# Patient Record
Sex: Female | Born: 1975 | Race: White | Hispanic: Yes | Marital: Single | Smoking: Never smoker
Health system: Southern US, Community
[De-identification: ages and names within clinical notes are randomized; demographics above are authoritative.]

## PROBLEM LIST (undated history)

## (undated) ENCOUNTER — Inpatient Hospital Stay (HOSPITAL_COMMUNITY): Payer: Self-pay

## (undated) DIAGNOSIS — Z789 Other specified health status: Secondary | ICD-10-CM

## (undated) HISTORY — PX: NO PAST SURGERIES: SHX2092

---

## 1999-03-29 ENCOUNTER — Emergency Department (HOSPITAL_COMMUNITY): Admission: EM | Admit: 1999-03-29 | Discharge: 1999-03-29 | Payer: Self-pay | Admitting: Emergency Medicine

## 2007-01-20 ENCOUNTER — Inpatient Hospital Stay (HOSPITAL_COMMUNITY): Admission: AD | Admit: 2007-01-20 | Discharge: 2007-01-25 | Payer: Self-pay | Admitting: Obstetrics

## 2007-01-22 ENCOUNTER — Encounter (INDEPENDENT_AMBULATORY_CARE_PROVIDER_SITE_OTHER): Payer: Self-pay | Admitting: Specialist

## 2010-03-21 ENCOUNTER — Inpatient Hospital Stay (HOSPITAL_COMMUNITY): Admission: RE | Admit: 2010-03-21 | Discharge: 2010-03-24 | Payer: Self-pay | Admitting: Obstetrics

## 2010-03-21 ENCOUNTER — Encounter: Payer: Self-pay | Admitting: Obstetrics

## 2011-02-21 LAB — CBC
HCT: 29.2 % — ABNORMAL LOW (ref 36.0–46.0)
Hemoglobin: 10.1 g/dL — ABNORMAL LOW (ref 12.0–15.0)
Hemoglobin: 11.9 g/dL — ABNORMAL LOW (ref 12.0–15.0)
MCHC: 33.6 g/dL (ref 30.0–36.0)
MCV: 89.8 fL (ref 78.0–100.0)
Platelets: 156 10*3/uL (ref 150–400)
RBC: 3.91 MIL/uL (ref 3.87–5.11)
WBC: 8.3 10*3/uL (ref 4.0–10.5)

## 2011-04-21 NOTE — Op Note (Signed)
NAMELENOX, LADOUCEUR                 ACCOUNT NO.:  [id number]   MEDICAL RECORD NO.:  [id number]          PATIENT TYPE:  INP   LOCATION:  9160                          FACILITY:  [facility]   PHYSICIAN:  Charles A. Clearance Coots, M.D.DATE OF BIRTH:  1976-11-07   DATE OF PROCEDURE:  01/22/2007  DATE OF DISCHARGE:                               OPERATIVE REPORT   PREOPERATIVE DIAGNOSES:  1. Marginal abruption.  2. Probable cephalopelvic disproportion.  3. Postdates.  4. Induction of labor.   POSTOPERATIVE DIAGNOSES:  1. Marginal abruption.  2. Probable cephalopelvic disproportion.  3. Postdates.  4. Induction of labor.   PROCEDURE:  Primary low transverse cesarean section.   SURGEON:  Coral Ceo, MD.   ASSISTANTS:  Kathlee Nations, surgical technician.  Sheila Oats, certified surgical technician.   ANESTHESIA:  Epidural.   ESTIMATED BLOOD LOSS:  700 mL.   IV FLUIDS:  3100 mL.   URINE OUTPUT:  300 mL.   COMPLICATIONS:  None.   DRAIN:  Foley to gravity.   FINDINGS:  A Viable female at 33, Apgars of 9 at 1 and 9 at 5, weight of  8 pounds 12 ounces.  Normal uterus, ovaries and fallopian tubes.   OPERATION:  The patient was brought to operating room and after  satisfactory redosing of the epidural, the abdomen was prepped and  draped in the usual sterile fashion.  A Pfannenstiel skin incision was  made with a scalpel and that was deepened down to the fascia with the  scalpel.  Fascia was nicked in the midline and the fascial incision was  extended to the left and to the right with curved Mayo scissors.  The  superior and inferior fascial edges were taken off the rectus muscles  with both blunt and sharp dissection.  Rectus muscle was bluntly divided  in the midline.  Peritoneum was entered digitally.  The peritoneum was  digitally extended to the left and to the right.  The bladder blade was  positioned.  The vesicouterine fold of peritoneum above the reflection  of the urinary  bladder was grasped with forceps and was incised and  undermined with Metzenbaum scissors.  The incision was extended to the  left and to the right with the Metzenbaum scissors.  The bladder flap  was bluntly developed and the bladder blade was repositioned in front of  the urinary bladder, placing it well out of the operative field.  The  uterus was then entered transversely in the lower uterine segment with a  scalpel.  The uterine incision was extended to the left and to right  digitally.  Clear amniotic fluid was expelled.  The vertex was then  brought up into the incision and the occiput was rotated into the  incision and delivered with the aid of fundal pressure from the  assistant.  Infant's nose and mouth were suctioned with a suction bulb  and delivery was completed with the aid of fundal pressure from the  assistant.  Umbilical cord was doubly clamped and cut and the infant was  handed off to the nursery staff.  Cord blood was obtained.  The placenta  was manually removed from the uterus intact.  The endometrial surface  was then thoroughly debrided with a dry lap sponge.  The edges of the  uterine incision were grasped with ring forceps.  The uterus was closed  with continuous interlocking suture of 0 Monocryl from each corner to  the center.  Hemostasis was excellent.  The bladder flap was then closed  with a continuous suture of 3-0 Monocryl.  The pelvic cavity was  thoroughly irrigated with warm saline solution and all clots were  removed.  Peritoneum was closed with a continuous suture of 2-0  Monocryl.  Fascia was closed with continuous suture of 0 Vicryl.  Subcutaneous tissue was thoroughly irrigated with warm saline solution  and all areas of subcutaneous bleeding were coagulated with the Bovie.  Skin was then closed with a continuous subcuticular suture of 3-0  Monocryl.  A sterile bandage was applied to the incision closure.  The  surgical technician indicated that all  needle, sponge and instrument  counts were correct x2.  The patient tolerated the procedure well and  was transported to the recovery room in satisfactory condition.      Charles A. Clearance Coots, M.D.  Electronically Signed     CAH/MEDQ  D:  01/22/2007  T:  01/22/2007  Job:  [id number]

## 2011-04-21 NOTE — Discharge Summary (Signed)
Katie Blackburn, Katie Blackburn                 ACCOUNT NO.:  [id number]   MEDICAL RECORD NO.:  [id number]          PATIENT TYPE:  INP   LOCATION:  9101                          FACILITY:  [facility]   PHYSICIAN:  Charles A. Clearance Coots, M.D.DATE OF BIRTH:  20-Sep-1976   DATE OF ADMISSION:  01/20/2007  DATE OF DISCHARGE:  01/25/2007                               DISCHARGE SUMMARY   ADMITTING DIAGNOSIS:  Post dates pregnancy induction of labor.   DISCHARGE DIAGNOSES:  1. Post dates pregnancy induction of labor.  2. Status post primary low transverse cesarean section on 01/22/2007      for marginal abruption.  Viable female delivered at 77.  Apgars of 9 at 1 minute and 9 at 5  minutes.  Weight of 3975 grams, length of 52 cm.  Mother and infant  discharged home in good condition.   REASON FOR ADMISSION:  A 35 year old G2 P0, estimated date of  confinement of 01/13/2007, admitted for induction of labor for post-  dates.  Prenatal care was uncomplicated.  Group B strep was negative.   PAST MEDICAL HISTORY:  Surgery - none.  Illnesses - none.   MEDICATIONS:  Prenatal vitamins.   ALLERGIES:  NO KNOWN DRUG ALLERGIES.   SOCIAL HISTORY:  Married.  Negative tobacco, alcohol or recreational  drug use.   PHYSICAL EXAM:  GENERAL:  Well-nourished, well-developed female in no  acute distress.  VITAL SIGNS:  She is afebrile, vital signs are stable.  LUNGS:  Clear to auscultation bilaterally.  HEART: Regular rate and rhythm.  ABDOMEN:  Gravid, nontender.  PELVIC:  Cervix 1-2 cm dilated, 50% effaced, vertex at -3 station.   ADMITTING LABORATORY VALUES:  Hemoglobin 12, hematocrit 34.2, white  blood cell count 8000, platelets 197,000.  RPR was nonreactive.   HOSPITAL COURSE:  The patient was admitted and cervical ripening was  done overnight.  Low-dose Pitocin was started the following morning.  The patient progressed to 4-5 cm dilatation and the vertex was not well  engaged in the pelvis and was ballottable, and  after examination at  0800, the patient started having heavy vaginal bleeding with clots.  Uterine contractions were every 3-5 minutes with decreased variability.  A decision was made to proceed with cesarean section delivery for  marginal abruption.  Primary low transverse cesarean section was  performed without complications on 01/22/2007.  Postoperative course was  uncomplicated.  The patient was discharged home on postop day #3 in good  condition.   DISCHARGE LABORATORY VALUES:  Hemoglobin 8.5, hematocrit 24.2, white  blood cell count 10,600, platelets 134,000.   DISCHARGE MEDICATIONS:  Tylox and ibuprofen was prescribed for pain.  Continue prenatal vitamins.   DISCHARGE INSTRUCTIONS:  Routine written instructions were given for  discharge after cesarean section.  The patient is to call the office for  a followup appointment in 2 weeks.      Charles A. Clearance Coots, M.D.  Electronically Signed     CAH/MEDQ  D:  03/07/2007  T:  03/07/2007  Job:  [id number]

## 2014-10-15 ENCOUNTER — Encounter (HOSPITAL_COMMUNITY): Payer: Self-pay | Admitting: *Deleted

## 2014-10-15 ENCOUNTER — Inpatient Hospital Stay (HOSPITAL_COMMUNITY)
Admission: AD | Admit: 2014-10-15 | Discharge: 2014-10-16 | Disposition: A | Discharge status: Home / Self Care | Payer: Self-pay | Source: Ambulatory Visit | Attending: Obstetrics & Gynecology | Admitting: Obstetrics & Gynecology

## 2014-10-15 DIAGNOSIS — O039 Complete or unspecified spontaneous abortion without complication: Secondary | ICD-10-CM | POA: Insufficient documentation

## 2014-10-15 DIAGNOSIS — O209 Hemorrhage in early pregnancy, unspecified: Secondary | ICD-10-CM

## 2014-10-15 DIAGNOSIS — O4691 Antepartum hemorrhage, unspecified, first trimester: Secondary | ICD-10-CM | POA: Insufficient documentation

## 2014-10-15 HISTORY — DX: Other specified health status: Z78.9

## 2014-10-15 LAB — CBC WITH DIFFERENTIAL/PLATELET
BASOS ABS: 0 10*3/uL (ref 0.0–0.1)
Basophils Relative: 0 % (ref 0–1)
Eosinophils Absolute: 0.1 10*3/uL (ref 0.0–0.7)
Eosinophils Relative: 1 % (ref 0–5)
HCT: 34.8 % — ABNORMAL LOW (ref 36.0–46.0)
Hemoglobin: 11.8 g/dL — ABNORMAL LOW (ref 12.0–15.0)
LYMPHS ABS: 2.4 10*3/uL (ref 0.7–4.0)
LYMPHS PCT: 30 % (ref 12–46)
MCH: 29.9 pg (ref 26.0–34.0)
MCHC: 33.9 g/dL (ref 30.0–36.0)
MCV: 88.3 fL (ref 78.0–100.0)
Monocytes Absolute: 0.3 10*3/uL (ref 0.1–1.0)
Monocytes Relative: 4 % (ref 3–12)
NEUTROS ABS: 5 10*3/uL (ref 1.7–7.7)
Neutrophils Relative %: 65 % (ref 43–77)
PLATELETS: 180 10*3/uL (ref 150–400)
RBC: 3.94 MIL/uL (ref 3.87–5.11)
RDW: 13 % (ref 11.5–15.5)
WBC: 7.8 10*3/uL (ref 4.0–10.5)

## 2014-10-15 LAB — WET PREP, GENITAL
Clue Cells Wet Prep HPF POC: NONE SEEN
Trich, Wet Prep: NONE SEEN
WBC, Wet Prep HPF POC: NONE SEEN
Yeast Wet Prep HPF POC: NONE SEEN

## 2014-10-15 LAB — HCG, QUANTITATIVE, PREGNANCY: HCG, BETA CHAIN, QUANT, S: 6539 m[IU]/mL — AB (ref <5)

## 2014-10-15 NOTE — MAU Note (Signed)
Pt reports vaginal bleeding since this pm, lower abd pain.

## 2014-10-15 NOTE — MAU Provider Note (Signed)
History     CSN: [id number]  Arrival date and time: 10/15/14 2055   None     Chief Complaint  Patient presents with  . Vaginal Bleeding  . Abdominal Pain   Patient is a 38 y.o. female presenting with vaginal bleeding. The history is provided by the patient. The history is limited by a language barrier. A language interpreter was used.  Vaginal Bleeding This is a new problem. The current episode started today. The problem occurs constantly. The problem has been gradually worsening. Associated symptoms include abdominal pain. Pertinent negatives include no chest pain, chills, coughing, fever, headaches, nausea, rash, sore throat or vomiting. Nothing aggravates the symptoms. She has tried nothing for the symptoms.    Katie Blackburn is a 38 y.o. G3P2 @ 5582w0d gestation who presents to the MAU with vaginal bleeding that started earlier today. She states the bleeding has gotten heavier and the cramping is worse.  She rates her pain as 10/10.   OB History    Gravida Para Term Preterm AB TAB SAB Ectopic Multiple Living   3 2        2       Past Medical History  Diagnosis Date  . Medical history non-contributory     Past Surgical History  Procedure Laterality Date  . No past surgeries      History reviewed. No pertinent family history.  History  Substance Use Topics  . Smoking status: Never Smoker   . Smokeless tobacco: Not on file  . Alcohol Use: No    Allergies: No Known Allergies  Prescriptions prior to admission  Medication Sig Dispense Refill Last Dose  . Prenatal Vit-Fe Fumarate-FA (MULTIVITAMIN-PRENATAL) 27-0.8 MG TABS tablet Take 1 tablet by mouth daily at 12 noon.   10/15/2014 at Unknown time    Review of Systems  Constitutional: Negative for fever and chills.  HENT: Negative for ear pain and sore throat.   Eyes: Negative for pain and redness.  Respiratory: Negative for cough and wheezing.   Cardiovascular: Negative for chest pain and palpitations.   Gastrointestinal: Positive for abdominal pain. Negative for nausea and vomiting.  Genitourinary: Positive for frequency and vaginal bleeding. Negative for urgency.  Skin: Negative for rash.  Neurological: Negative for dizziness and headaches.     Blood pressure 112/80, pulse 72, resp. rate 18, height 5' 1.5" (1.562 m), weight 117 lb (53.071 kg), last menstrual period 08/13/2014, SpO2 100 %.  Physical Exam  Nursing note and vitals reviewed. Constitutional: She is oriented to person, place, and time. She appears well-developed and well-nourished.  HENT:  Head: Normocephalic and atraumatic.  Eyes: Conjunctivae and EOM are normal.  Neck: Neck supple.  Cardiovascular: Normal rate.   Respiratory: Effort normal.  GI: Soft. There is tenderness.  Mildly tender lower abdomen with palpation.   Genitourinary:  External genitalia without lesions. Moderate blood with clots vaginal vault. Cervix closed, mild CMT, mild bilateral adnexal tenderness, uterus slightly enlarged.   Musculoskeletal: Normal range of motion.  Neurological: She is alert and oriented to person, place, and time.  Skin: Skin is warm and dry.  Psychiatric: She has a normal mood and affect. Her behavior is normal.    MAU Course  Procedures Results for orders placed or performed during the hospital encounter of 10/15/14 (from the past 24 hour(s))  CBC with Differential     Status: Abnormal   Collection Time: 10/15/14  9:08 PM  Result Value Ref Range   WBC 7.8 4.0 - 10.5 K/uL  RBC 3.94 3.87 - 5.11 MIL/uL   Hemoglobin 11.8 (L) 12.0 - 15.0 g/dL   HCT 16.134.8 (L) 09.636.0 - 04.546.0 %   MCV 88.3 78.0 - 100.0 fL   MCH 29.9 26.0 - 34.0 pg   MCHC 33.9 30.0 - 36.0 g/dL   RDW 40.913.0 81.111.5 - 91.415.5 %   Platelets 180 150 - 400 K/uL   Neutrophils Relative % 65 43 - 77 %   Neutro Abs 5.0 1.7 - 7.7 K/uL   Lymphocytes Relative 30 12 - 46 %   Lymphs Abs 2.4 0.7 - 4.0 K/uL   Monocytes Relative 4 3 - 12 %   Monocytes Absolute 0.3 0.1 - 1.0 K/uL    Eosinophils Relative 1 0 - 5 %   Eosinophils Absolute 0.1 0.0 - 0.7 K/uL   Basophils Relative 0 0 - 1 %   Basophils Absolute 0.0 0.0 - 0.1 K/uL  hCG, quantitative, pregnancy     Status: Abnormal   Collection Time: 10/15/14  9:08 PM  Result Value Ref Range   hCG, Beta Chain, Quant, S 6539 (H) <5 mIU/mL  ABO/Rh     Status: None (Preliminary result)   Collection Time: 10/15/14  9:08 PM  Result Value Ref Range   ABO/RH(D) O POS   Wet prep, genital     Status: None   Collection Time: 10/15/14 11:30 PM  Result Value Ref Range   Yeast Wet Prep HPF POC NONE SEEN NONE SEEN   Trich, Wet Prep NONE SEEN NONE SEEN   Clue Cells Wet Prep HPF POC NONE SEEN NONE SEEN   WBC, Wet Prep HPF POC NONE SEEN NONE SEEN    [place] Ob Comp Less 14 Wks  10/16/2014   CLINICAL DATA:  Pregnant patient with vaginal bleeding. Quantitative HCG 6,513.  EXAM: OBSTETRIC <14 WK US AND TRANSVAGINAL OB US  TECHNIQUE: Both transabdominal and transvaginal ultrasound examinations were performed for complete evaluation of the gestation as well as the maternal uterus, adnexal regions, and pelvic cul-de-sac. Transvaginal technique was performed to assess early pregnancy.  COMPARISON:  None.  FINDINGS: Intrauterine gestational sac: Visualized/normal in shape. The gestational sac is only appearance segment.  Yolk sac:  Visualized.  Embryo:  Visualized.  Cardiac Activity: Not detected.  CRL:   1.47  mm   7 w 6 d                  US EDC: 05/29/2015.  Maternal uterus/adnexae: Unremarkable.  IMPRESSION: Findings meet definitive criteria for failed pregnancy. This follows SRU consensus guidelines: Diagnostic Criteria for Nonviable Pregnancy Early in the First Trimester. Macy Mis Engl J Med [id number];369:1443-51. The gestational sack is in the lower uterine segment suggesting pending abortion.   Electronically Signed   By: Drusilla Kannerhomas  Dalessio M.D.   On: 10/16/2014 01:01   [place] Ob Transvaginal  10/16/2014   CLINICAL DATA:  Pregnant patient with vaginal bleeding.  Quantitative HCG 6,513.  EXAM: OBSTETRIC <14 WK US AND TRANSVAGINAL OB US  TECHNIQUE: Both transabdominal and transvaginal ultrasound examinations were performed for complete evaluation of the gestation as well as the maternal uterus, adnexal regions, and pelvic cul-de-sac. Transvaginal technique was performed to assess early pregnancy.  COMPARISON:  None.  FINDINGS: Intrauterine gestational sac: Visualized/normal in shape. The gestational sac is only appearance segment.  Yolk sac:  Visualized.  Embryo:  Visualized.  Cardiac Activity: Not detected.  CRL:   1.47  mm   7 w 6 d  [place] EDC: 05/29/2015.  Maternal uterus/adnexae: Unremarkable.  IMPRESSION: Findings meet definitive criteria for failed pregnancy. This follows SRU consensus guidelines: Diagnostic Criteria for Nonviable Pregnancy Early in the First Trimester. Macy Mis J Med [phone]. The gestational sack is in the lower uterine segment suggesting pending abortion.   Electronically Signed   By: Drusilla Kanner M.D.   On: 10/16/2014 01:01    Assessment and Plan  38 y.o. G3P2 @ [redacted]w[redacted]d by LMP and ultrasound showing 7 week 6 day failed pregnancy here tonight with vaginal bleeding and cramping that about 10 hours prior to arrival to MAU. Using the translator I discussed in detail with the patient clinical and ultrasound findings and options for miscarriage. Patient elects to take Cytotec and try to pass the pregnancy. I have scheduled a follow up with the GYN Clinic for 10/28/14. Patient voices understanding and agrees with plan. Instruction sheet on Cytotec given to the patient.    Medication List    TAKE these medications        HYDROcodone-acetaminophen 5-325 MG per tablet  Commonly known as:  NORCO  Take 1 tablet by mouth every 6 (six) hours as needed for moderate pain.     misoprostol 200 MCG tablet  Commonly known as:  CYTOTEC  Take 4 tablets (800 mcg total) by mouth 4 (four) times daily.     multivitamin-prenatal 27-0.8 MG  Tabs tablet  Take 1 tablet by mouth daily at 12 noon.     promethazine 25 MG tablet  Commonly known as:  PHENERGAN  Take 0.5 tablets (12.5 mg total) by mouth every 6 (six) hours as needed for nausea.          Katie Blackburn 10/16/2014, 1:12 AM

## 2014-10-16 ENCOUNTER — Inpatient Hospital Stay (HOSPITAL_COMMUNITY): Payer: Self-pay

## 2014-10-16 LAB — HIV ANTIBODY (ROUTINE TESTING W REFLEX): HIV 1&2 Ab, 4th Generation: NONREACTIVE

## 2014-10-16 LAB — GC/CHLAMYDIA PROBE AMP
CT PROBE, AMP APTIMA: NEGATIVE
GC PROBE AMP APTIMA: NEGATIVE

## 2014-10-16 LAB — RPR

## 2014-10-16 LAB — ABO/RH: ABO/RH(D): O POS

## 2014-10-16 MED ORDER — PROMETHAZINE HCL 25 MG PO TABS: 12.5000 mg | ORAL_TABLET | Freq: Four times a day (QID) | ORAL | Status: AC | PRN

## 2014-10-16 MED ORDER — MISOPROSTOL 200 MCG PO TABS: 800.0000 ug | ORAL_TABLET | Freq: Four times a day (QID) | ORAL | Status: DC

## 2014-10-16 MED ORDER — HYDROCODONE-ACETAMINOPHEN 5-325 MG PO TABS: 1.0000 | ORAL_TABLET | Freq: Four times a day (QID) | ORAL | Status: DC | PRN

## 2014-10-16 NOTE — Progress Notes (Signed)
I assisted Linnette RN with some questions, I also assisted the patient in Fertileunltrasound, by Orlan LeavensViria Alvarez Interpreter.

## 2014-10-16 NOTE — Discharge Instructions (Signed)
Follow up in the GYN Clinic 10/28/14.  If you have problems before then, return here   FACTS YOU SHOULD KNOW  WHAT IS AN EARLY PREGNANCY FAILURE? Once the egg is fertilized with the sperm and begins to develop, it attaches to the lining of the uterus. This early pregnancy tissue may not develop into an embryo (the beginning stage of a baby). Sometimes an embryo does develop but does not continue to grow. These problems can be seen on ultrasound.   MANAGEMNT OF EARLY PREGNANCY FAILURE: About 4 out of 100 (0.25%) women will have a pregnancy loss in her lifetime.  One in five pregnancies is found to be an early pregnancy failure.  There are 3 ways to care for an early pregnancy failure:   (1) Surgery, (2) Medicine, (3) Waiting for you to pass the pregnancy on your own. The decision as to how to proceed after being diagnosed with and early pregnancy failure is an individual one.  The decision can be made only after appropriate counseling.  You need to weigh the pros and cons of the 3 choices. Then you can make the choice that works for you. SURGERY (D&E)  Procedure over in 1 day  Requires being put to sleep  Bleeding may be light  Possible problems during surgery, including injury to womb(uterus)  Care provider has more control Medicine (CYTOTEC)  The complete procedure may take days to weeks  No Surgery  Bleeding may be heavy at times  There may be drug side effects  Patient has more control Waiting  You may choose to wait, in which case your own body may complete the passing of the abnormal early pregnancy on its own in about 2-4 weeks  Your bleeding may be heavy at times  There is a small possibility that you may need surgery if the bleeding is too much or not all of the pregnancy has passed. CYTOTEC MANAGEMENT Prostaglandins (cytotec) are the most widely used drug for this purpose. They cause the uterus to cramp and contract. You will place the medicine yourself inside your  vagina in the privacy of your home. Empting of the uterus should occur within 3 days but the process may continue for several weeks. The bleeding may seem heavy at times. POSSIBLE SIDE EFFECTS FROM CYTOTEC  Nausea   Vomiting  Diarrhea Fever  Chills  Hot Flashes Side effects  from the process of the early pregnancy failure include:  Cramping  Bleeding  Headaches  Dizziness RISKS: This is a low risk procedure. Less than 1 in 100 women has a complication. An incomplete passage of the early pregnancy may occur. Also, Hemorrhage (heavy bleeding) could happen.  Rarely the pregnancy will not be passed completely. Excessively heavy bleeding may occur.  Your doctor may need to perform surgery to empty the uterus (D&E). Afterwards: Everybody will feel differently after the early pregnancy completion. You may have soreness or cramps for a day or two. You may have soreness or cramps for day or two.  You may have light bleeding for up to 2 weeks. You may be as active as you feel like being. If you have any of the following problems you may call Maternity Admissions Unit at [phone].  If you have pain that does not get better  with pain medication  Bleeding that soaks through 2 thick full-sized sanitary pads in an hour  Cramps that last longer than 2 days  Foul smelling discharge  Fever above 100.4 degrees F Even if  you do not have any of these symptoms, you should have a follow-up exam to make sure you are healing properly. This appointment will be made for you before you leave the hospital. Your next normal period will start again in 4-6 week after the loss. You can get pregnant soon after the loss, so use birth control right away. Finally: Make sure all your questions are answered before during and after any procedure. Follow up with medical care and family planning methods.

## 2014-10-28 ENCOUNTER — Ambulatory Visit (INDEPENDENT_AMBULATORY_CARE_PROVIDER_SITE_OTHER): Payer: Self-pay | Admitting: Obstetrics & Gynecology

## 2014-10-28 ENCOUNTER — Encounter: Payer: Self-pay | Admitting: Obstetrics & Gynecology

## 2014-10-28 VITALS — BP 114/70 | HR 70 | Temp 98.2°F | Wt 116.9 lb

## 2014-10-28 DIAGNOSIS — Z30011 Encounter for initial prescription of contraceptive pills: Secondary | ICD-10-CM

## 2014-10-28 DIAGNOSIS — O021 Missed abortion: Secondary | ICD-10-CM | POA: Insufficient documentation

## 2014-10-28 MED ORDER — NORGESTIMATE-ETH ESTRADIOL 0.25-35 MG-MCG PO TABS: 1.0000 | ORAL_TABLET | Freq: Every day | ORAL | Status: DC

## 2014-10-28 NOTE — Progress Notes (Signed)
   CLINIC ENCOUNTER NOTE  History:  38 y.o. G3P2 here today for follow up after MAU visit on 10/16/14 when she was found to have a 8 week MAB and elected for misoprostol treatment.  Patient is Spanish-speaking only, [place] interpreter present for this encounter.  Reports having some bleeding but unsure if she passed tissue.  She does not desire any other pregnancy soon, desires OCPs for contraception. Will also use condoms.  The following portions of the patient's history were reviewed and updated as appropriate: allergies, current medications, past family history, past medical history, past social history, past surgical history and problem list. Normal pap in 2011.  Review of Systems:  Pertinent items are noted in HPI.  Objective:  Physical Exam  BP 114/70 mmHg  Pulse 70  Temp(Src) 98.2 F (36.8 C) (Oral)  Wt 116 lb 14.4 oz (53.025 kg)  LMP 08/13/2014 Gen: NAD Abd: Soft, nontender and nondistended Pelvic:Deferred  Clinic transabdominal ultrasound:  Not able to visualize uterus well, but nothing visualized within uterus  Labs and Imaging 10/16/2014   OBSTETRIC <14 WK US AND TRANSVAGINAL OB US CLINICAL DATA:  Pregnant patient with vaginal bleeding. Quantitative HCG 6,513.   TECHNIQUE: Both transabdominal and transvaginal ultrasound examinations were performed for complete evaluation of the gestation as well as the maternal uterus, adnexal regions, and pelvic cul-de-sac. Transvaginal technique was performed to assess early pregnancy.  COMPARISON:  None.  FINDINGS: Intrauterine gestational sac: Visualized/normal in shape. The gestational sac is only appearance segment.  Yolk sac:  Visualized.  Embryo:  Visualized.  Cardiac Activity: Not detected.  CRL:   1.47  mm   7 w 6 d                  US EDC: 05/29/2015.  Maternal uterus/adnexae: Unremarkable.  IMPRESSION: Findings meet definitive criteria for failed pregnancy. This follows SRU consensus guidelines: Diagnostic Criteria for  Nonviable Pregnancy Early in the First Trimester. Macy Mis Engl J Med [id number];369:1443-51. The gestational sack is in the lower uterine segment suggesting pending abortion.   Electronically Signed   By: Drusilla Kannerhomas  Dalessio M.D.   On: 10/16/2014 01:01   10/16/2014 HCG 6539   Assessment & Plan:  Likely completed treatment of MAB, will get HCG to confirm Sprintec prescribed Follow up as needed. Routine preventative health maintenance measures emphasized.   Jaynie CollinsUGONNA  Ronell Boldin, MD, FACOG Attending Obstetrician & Gynecologist [facility], [facility]

## 2014-10-28 NOTE — Progress Notes (Signed)
[employer] interpreter [phone]#[id number] used for this encounter,

## 2014-10-28 NOTE — Patient Instructions (Signed)
Regrese a la clinica cuando tenga su cita. Si tiene problemas o preguntas, llama a la clinica o vaya a la sala de [place]. Cuidados preventivos en los adultos (Preventive Care for Adults) Un estilo de vida saludable y los cuidados preventivos pueden favorecer la salud y [place]. Las pautas de salud preventivas para las mujeres incluyen las siguientes prcticas clave:  Un examen fsico de rutina anual y Optometrist estudios preventivos es un buen modo de Chief Technology Officer su salud. [place] de Publishing rights manager preocupaciones y Civil engineer, contracting el estado de su salud, y que le realicen estudios completos.  Consulte al dentista para realizar un examen de rutina y cuidados preventivos cada 6 meses. Cepllese los dientes al [employer]ys[employer]us veces por da y psese el hilo dental al menos una vez por da. Una buena higiene bucal evita caries y enfermedades de las encas.  La frecuencia con que debe hacerse exmenes de la vista depende de su edad, su estado de [place], su historia familiar, el uso de lentes de contacto y otros factores. Siga las recomendaciones del mdico para saber con qu frecuencia debe hacerse exmenes de la vista.  Consuma una dieta saludable. Los alimentos que contienen vegetales, las frutas, los cereales [place], los productos lcteos bajos en grasas y las protenas magras contienen nutrientes que son necesarios, sin consumir Nurse, mental health. Disminuya la ingesta de alimentos ricos en grasas slidas, azcares y sal agregadas. Consuma la cantidad de caloras adecuada para usted. Si es necesario, pdale informacin acerca de una dieta [place] a su mdico.  Realizar actividad fsica de forma regular es una de las prcticas ms importantes que puede hacer por su salud. Los adultos deben hacer al menos 150 minutos de ejercicios de intensidad moderada (cualquier actividad que aumente la frecuencia cardaca y lo haga transpirar) cada semana. Adems, la [employer] de los adultos  necesita practicar ejercicios de fortalecimiento muscular dos o ms veces por semana.  Mantenga un peso saludable. El ndice de masa corporal [facility]) es una herramienta que identifica posibles problemas con [place]. Proporciona una estimacin de la grasa corporal basndose en el peso y la altura. El mdico podr determinar su [facility] y ayudarlo a Scientist, forensic o Theatre manager un peso saludable. Para los adultos de 20 aos o ms:  Un [facility] menor de 18,5 se considera bajo peso.  Un [facility] entre 18,5 y 24,9 es normal.  Un [facility] entre 25 y 29,9 se considera sobrepeso.  Un IMC de 30 o ms se considera obesidad.  Realice actividad fsica y evite ingerir grasas saturadas para mantener un nivel normal de lpidos y Research scientist (life sciences). Consuma una dieta equilibrada y saludable, e incluya variedad de frutas y Photographer. A partir de los 20 aos se deben realizar anlisis de sangre a fin de Freight forwarder nivel de lpidos y colesterol en la sangre y [place] cada 5 aos. Si los niveles de lpidos o colesterol son altos, tiene ms de 50 aos o tiene riesgo elevado de sufrir enfermedades cardacas, Designer, industrial/product controlarse con ms frecuencia. Si tiene Coca Cola de lpidos y colesterol, debe recibir tratamiento con medicamentos, si la dieta y el ejercicio no estn funcionando.  Si fuma, consulte con el mdico acerca de las opciones para dejar de [place]. Si no fuma, no comience.  Se recomienda realizar exmenes de deteccin de cncer de pulmn a personas adultas entre 52 y 28 aos que estn en riesgo de Horticulturist, commercial de pulmn por sus antecedentes de consumo de tabaco. Ophelia Charter  fumado durante 30 aos un paquete diario, y sigan fumando o hayan dejado el hbito en algn momento en los ltimos 15 aos, se recomienda realizarse una tomografa computarizada de baja dosis de los pulmones todos los [place]. Fumar durante un ao-paquete equivale a fumar un promedio de un paquete de cigarrillos diario durante un ao (por  ejemplo: un paquete por da durante [place] paquetes por da durante 15 aos). Los exmenes anuales deben continuar hasta que el fumador haya dejado de fumar durante un mnimo de 15 aos. Ya no deben Emergency planning/management officer que tengan un problema de salud que les impida recibir tratamiento para el cncer de pulmn.  Si est embarazada, no beba alcohol. Si est amamantando, beba alcohol con prudencia. Si no est embarazada y decide beber alcohol, no beba ms de Naval architect. Se considera una medida a 12onzas (341m) de cerveza, 5onzas (1438m de vino, o 1,7,[id number] licor.  Evite el consumo de drogas. No comparta agujas. Pida ayuda si necesita asistencia o instrucciones con respecto a abandonar el consumo de drogas.  La hipertensin arterial causa enfermedades cardacas y auSerbial riesgo de ictus. Debe controlar su presin arterial al menos cada uno o doGlen HavenLa hipertensin arterial que persiste debe tratarse con medicamentos si la prdida de peso y el ejercicio no son efectivos.  Si tiene entre [phone], consulte a su mdico si debe tomar aspirina para prevenir ictus.  Los anlisis para la diabetes incluyen la toma de unTanzaniae sangre para controlar el nivel de azcar en la sangre durante el ayVanderbiltDebe hacerlo cada 3 aos despus de los 4554os si est dentro de su peso normal y sin factores de riesgo para la diabetes. Las pruebas deben comenzar a edades tempranas o llevarse a cabo con ms frecuencia si tiene sobrepeso y al menos un factor de riesgo para la diabetes.  Las evaluaciones para dePublic affairs consultante mama son un mtodo preventivo fundamental para las mujeres. Debe practicar la "autoconciencia de las mamas". Esto significa que deChief Technology Officerpariencia normal de sus mamas y cmo se sienten, y haElectrical engineern autoexamen de maGlass blower/designerSi detecta algn cambio, no importa cun pequeo sea, debe informarlo a su mdico. Las muConAgra Foods0 y 3088os deben hacerse un  examen clnico de las mamas como parte del examen regular de saLyonscada uno a tres aos. Despus de los [address]deben haCoca-ColaA partir de los [id number]deben hacerse una mamografa (radiografa de mamas) cada ao. Las mujeres con antecedentes familiares de cncer de mama deben hablar con el mdico para someterse a un estudio gentico. Las que tienen ms riesgo deben hacerse una resonancia magntica y unLavinia Sharpsodos loIvins La evaluacin del riesgo de cncer relacionado con el gen del cncer de mama (BRCA) se recomienda a mujeres que tengan familiares con cncer relacionado con el BRCA. Los cnceres relacionados con el BRCA incluyen el cncer de mama, de ovario, de trompas y peritoneal. TeRaynelle Janamiliares con estos cnceres puede estar asociado con un mayor riesgo de cambios dainos (mutaciones) en los genes del cncer de mama BRCA1 y BRCA2. Los resultados de la evaluacin determinarn la necesidad de asesoramiento gentico y de anTell Citye BRCA1 y BRCA2.  Ya no se recomiendan los exmenes plvicos de rutina para la deteccin del cncer en las mujeres que no estn embarazadas que son consideradas sujetos de bajo riesgo de teAnimal nutritioniste  los rganos de la pelvis (ovarios, tero y vagina) y no tienen sntomas. Pregntele al mdico si un examen plvico de deteccin es adecuado para usted.  Si ha recibido un tratamiento para Science writer cervical o una enfermedad que podra causar cncer, necesitar realizarse una prueba de Papanicolaou y controles durante al menos 59 aos de concluido el [place]. Si no se ha hecho el Papanicolaou con regularidad, debern volver a evaluarse los factores de riesgo (como tener un nuevo compaero sexual), para Teacher, adult education si debe realizarse los estudios nuevamente. Algunas mujeres sufren problemas mdicos que aumentan la probabilidad de Museum/gallery curator cncer de cuello del tero. En estos casos, el mdico podr [employer] se realicen controles y pruebas de  Papanicolaou con ms frecuencia.  La prueba del VPH es un anlisis adicional que puede usarse para Film/video editor de cuello del tero. Esta prueba busca la presencia del virus que causa los cambios en el cuello. Las clulas que se recolectan durante la prueba de Papanicolaou pueden usarse para el VPH. Se debe realizar la prueba para la deteccin del VPH a mujeres de ms de 67 aos y a Midwife de cualquier edad [employer]OK de la prueba de Papanicolaou no sean claros. Despus de los 30 aos, las mujeres deben hacerse el anlisis para el VPH con la misma frecuencia que la prueba de Papanicolaou.  El cncer colorrectal puede detectarse y con frecuencia puede prevenirse. La mayor parte de los estudios de rutina se deben Medical laboratory scientific officer a Field seismologist a Proofreader de los 15 aos y [place] 43 aos. Sin embargo, el mdico podr aconsejarle que lo haga antes, si tiene factores de riesgo para el cncer de colon. Una vez por ao, el mdico le dar un kit de prueba para Hydrologist en la materia fecal. La utilizacin de un tubo con una pequea cmara en su extremo para examinar directamente el colon (sigmoidoscopa o colonoscopa), puede detectar formas tempranas de cncer colorrectal. Hable con su mdico si tiene 41 aos, edad en la que debe comenzar a Optometrist los estudios de [place]. El examen directo del colon debe repetirse cada 5 a 10 aos, hasta los 75 aos, excepto que se encuentren formas tempranas de plipos precancerosos o pequeos bultos.  Las personas con un riesgo mayor de Insurance risk surveyor hepatitis B deben realizarse anlisis para Futures trader virus. Se considera que tiene un alto riesgo de Museum/gallery curator hepatitis B si:  Naci en un pas donde la hepatitis B es frecuente. Pregntele a su mdico qu pases son considerados de Public affairs consultant.  Sus padres nacieron en un pas de alto riesgo y usted no recibi una vacuna que lo proteja contra la hepatitis B (vacuna contra la hepatitis B).  [place].  [place] agujas para  inyectarse drogas.  Vive o tiene sexo con alguien que tiene hepatitis B.  Recibe tratamiento de hemodilisis.  Toma ciertos medicamentos para Nurse, mental health, trasplante de rganos y afecciones autoinmunes.  Se recomienda realizar un anlisis de sangre para Hydrographic surveyor hepatitis C a todas las personas nacidas entre 1945 y 1965, y a todo aquel que sepa que tiene riesgo de haber contrado esta enfermedad.  Practique el sexo seguro. Use condones y evite las prcticas sexuales riesgosas para disminuir el contagio de enfermedades de transmisin sexual (ETS). Algunas ETS son la gonorrea, clamidia, sfilis, tricomoniasis, herpes, VPH y el virus de inmunodeficiencia humana (VIH). El herpes, el VIH y [place] VPH son enfermedades virales que no tienen [place]. Pueden producir discapacidad, cncer y [employer].  Debe realizarse pruebas de deteccin de enfermedades de transmisin sexual (ETS), incluidas gonorrea y clamidia si:  Es sexualmente activa y es menor de 24aos.  Es mayor de 24aos, y Investment banker, operational informa que corre riesgo de tener este tipo de [place].  La actividad sexual ha cambiado desde que le hicieron la ltima prueba de deteccin y tiene un riesgo mayor de Best boy clamidia o Radio broadcast assistant. Pregntele al mdico si usted tiene riesgo.  Si tiene riesgo de infectarse por el VIH, se recomienda tomar diariamente un medicamento recetado para evitar la infeccin. Esto se conoce como profilaxis previa a la exposicin. Se considera que est en riesgo si:  Es Dalene Seltzer heterosexual, es sexualmente [place] y tiene ms riesgo de Museum/gallery curator una infeccin por VIH.  Se inyecta drogas.  Es sexualmente activo con una pareja que tiene VIH.  Consulte a su mdico para saber si tiene un alto riesgo de infectarse por el VIH. Si opta por comenzar la profilaxis previa a la exposicin, primero debe realizarse anlisis de deteccin del VIH. Luego, le harn anlisis cada 43mses mientras est tomando los  medicamentos para la profilaxis previa a la exposicin.  La osteoporosis es una enfermedad en la que los huesos pierden los minerales y la fuerza por el avance de la edad. El resultado pueden ser fracturas o quebraduras graves en l[place] El riesgo de osteoporosis puede identificarse con uArdelia Memsprueba de densidad sea. Las mujeres de ms de 671aos y las que tengan riesgos de sufrir fracturas u osteoporosis deben discutir las opciones de control con su mdico. Consulte a su mdico si debe tomar un suplemento de calcio o de vitamina D para reducir el riesgo de osteoporosis.  La menopausia est asociada a sntomas y riesgos fsicos. Se dispone de una terapia de reemplazo hormonal para disminuir los sntomas y lRed Oak Consulte a su mdico para saber si la terapia de reemplazo hormonal es conveniente para usted.  Utilice pantalla solar. Aplique pantalla solar de mKerry Doryy repetida a lo largo del dTraining and development officer Resgurdese del sol cuando la sombra sea ms pequea que usted. Protjase usando mangas y pantalones largos, un sombrero de ala ancha y anteojos para el sol todo el ao, siempre que se encuentre al aDale  Una vez por mes hgase un examen de la piel de todo el cuerpo usando un espejo para ver la espalda. Informe al mdico si aparecen nuevos lunares, o si nota que los que ya tena ahora tienen bordes iVernon aumentaron su tamao y son ms grandes que una goma de lpiz o si su forma o color cambi.  Mantngase al da con las vacunas obligatorias .  Vacuna antigripal. Todas las personas adultas deben vacunarse cada ao.  Vacuna contra la difteria, ttanos y pAdvice worker(DT, DTPa). Las mujeres embarazadas deben recibir una dosis de la vacuna DTPa en cada embarazo. Se debe recibir la dosis independientemente de cunto tiempo haya transcurrido desde la ltima dosis. Es preferible vacunarse entre la semana 246y 363de la gestacin. Una persona adulta que no haya recibido la vacuna DTPa  anteriormente o que no sabe su estado de vacunacin debe recibir una dosis. Despus de esta dosis inicial, necesitar aplicarse un refuerzo de la vacuna contra la difteria y el ttanos (DT) cada 10 aos. Las pShip brokerque no sepan o no hayan recibido la serie de vacunacin de tres dosis contra la difteria y el ttanos deben iniciar o finalizar una serie de vacunacin primaria, que incluye la dosis  contra la difteria, el ttanos y Research officer, trade union (DTPa). Las personas adultas deben recibir una dosis de refuerzo de DT cada 10 aos.  Vacuna contra la varicela. Ardelia Mems persona adulta sin prueba de inmunidad a la varicela debe recibir dos dosis o una segunda dosis si recibi una dosis previamente. Las embarazadas sin prueba de inmunidad deben recibir la primera dosis despus del Media planner. Esta primera dosis se debe aplicar antes del alta del centro de salud. La segunda dosis debe aplicarse entre 4 y 8 semanas posteriores a la primera dosis.  Vacuna contra el virus del Engineer, technical sales (VPH). Las ConAgra Foods 13 y 41 aos que no hayan recibido la vacuna antes deben recibir la serie de 3 dosis. La vacuna no se recomienda en mujeres embarazadas. Sin embargo, no es Chartered loss adjuster una prueba de [place] antes de recibir una dosis. Si se descubre que una mujer est embarazada despus de recibir la dosis, no se Producer, television/film/video. En ese caso, las dosis restantes deben retrasarse hasta despus del embarazo. Se recomienda la vacuna para cualquier persona inmunodeprimida hasta la edad de 26 aos si no recibi [place] o ninguna de las dosis anteriormente. Durante la serie de 3 dosis, la segunda dosis debe Enterprise Products 4 y 8 semanas posteriores a la primera dosis. La tercera dosis debe aplicarse 24 semanas despus de la primera dosis y 16 semanas despus de la segunda dosis.  Vacuna contra el herpes zoster. Se recomienda una dosis en personas [employer] de 2 aos a menos que sufran ciertas  enfermedades.  [place] contra el sarampin, la rubola y las paperas (SRP) Los adultos nacidos antes de 1957 generalmente se consideran inmunes al sarampin y las paperas. Las Forensic scientist en [phone] o posteriormente deben recibir una o ms dosis de la vacuna SRP, a menos que [employer] Mutual [employer] Omaha contraindicacin para la vacuna o que tengan prueba de inmunidad a las tres enfermedades. Se debe aplicar una segunda dosis de rutina de la vacuna SRP al menos 28das despus de la primera dosis a estudiantes de escuelas terciarias, trabajadores de la salud o viajeros internacionales. Las personas que recibieron la vacuna inactivada contra el sarampin o algn tipo desconocido de vacuna contra el sarampin [place] y 1967 deben recibir dos dosis de la vacuna [place]. Las Advertising copywriter recibieron la vacuna inactivada contra las paperas o algn tipo desconocido de vacuna contra las paperas antes de 1979 y tienen un alto riesgo de infectarse con la enfermedad deben considerar vacunarse con dos dosis de la vacuna SRP. En las mujeres en edad frtil, debe determinarse la inmunidad contra la rubola. Si no hay prueba de inmunidad, las mujeres que no estn embarazadas deben vacunarse. Si no hay prueba de inmunidad, las mujeres que estn embarazadas deben retrasar la vacunacin hasta despus del [place]. Los trabajadores de [employer]	[place] no vacunados que nacieron antes de 1957 y que no tienen prueba de inmunidad contra el sarampin, la rubola y las paperas o no tienen confirmacin de laboratorio de la enfermedad deben considerar vacunarse contra el sarampin y las paperas con dos dosis de la vacuna [place], y contra la rubola con una dosis de la vacuna SRP.  Vacuna antineumoccica conjugada 13 valente (PCV13). Segn indicacin mdica, una persona que no conozca su historia de vacunacin y no tenga registro de vacunas debe recibir la vacuna PCV13. Una persona de 19 aos o ms que tenga ciertas enfermedades y no se haya vacunado debe  recibir una dosis de la vacuna PCV13. Despus  de esta vacuna, se debe aplicar una dosis de la vacuna antineumoccica de polisacridos (PPSV23). La dosis de la vacuna PPSV23 se debe recibir al menos ocho semanas despus de la dosis de la vacuna PCV13. Una persona de 19 aos o ms, que tenga ciertas enfermedades y Recruitment consultant recibido una o ms dosis de la vacuna PPSV23 previamente debe recibir una dosis de la vacuna PCV13. La dosis de la vacuna [id number] se debe aplicar uno o ms aos despus de la dosis de la vacuna PPSV23.  Vacuna antineumoccica de polisacridos (PPSV23). Si se indica la vacuna PCV13, primero debe recibir la vacuna PCV13. Todas las personas de 65 aos o mayores deben recibir la vacuna. Una Network engineer de 18 aos que tenga ciertas enfermedades se Teacher, English as a foreign language. Cleora Fleet persona que viva en un hogar de Mining engineer o en un centro de atencin durante mucho tiempo se debe vacunar. Un fumador adulto se Teacher, English as a foreign language. Las personas inmunodeprimidas o con otras enfermedades deben recibir ambas vacunas, PCV13 y PPSV23. Las personas infectadas con el virus de la inmunodeficiencia humana (VIH) deben recibir la vacuna lo antes posible despus del diagnstico. Se debe evitar la vacunacin durante tratamientos de quimioterapia y radioterapia. El uso de rutina de la vacuna PPSV23 no est recomendado para Teacher, early years/pre, personas nativas de [place] o [employer] de 65aos, salvo que tengan ciertas enfermedades que requieran la vacuna. Segn indicacin mdica, las personas que no conozcan su historia de vacunacin y no tengan registros de [place], deben recibir la vacuna PPSV23. Se recomienda una nica revacunacin 5 aos despus de recibir la primera dosis de PPSV23 para personas de 19 a 37 aos con insuficiencia renal crnica, sndrome nefrtico, asplenia o inmunodepresin. Las [place] [employer] recibieron de una a dos dosis de PPSV23 antes de los 65 aos deben recibir otra dosis de [place] a los 65 aos de edad o posteriormente  si pasaron cinco aos, como mnimo, desde la dosis anterior. Las dosis de PPSV23 no son necesarias para personas que ya recibieron la vacuna a los 88 aos o posteriormente.  Vacuna antimeningoccica. Los adultos con asplenia o con persistentes deficiencias de componentes terminales del complemento deben recibir dos dosis de la vacuna antimeningoccica conjugada tetravalente (MenACWY-D). Las dosis se deben Midwife con un mnimo de 2 meses de diferencia. Deben vacunarse los microbilogos que trabajan con ciertas bacterias meningoccicas, reclutas militares y personas que viajan o viven en pases con una alta tasa de meningitis. Los estudiantes universitarios de Tourist information centre manager la edad de 21 que vivan en una residencia estudiantil deben recibir una dosis si no se aplicaron la vacuna cuando cumplieron o despus de cumplir 16 aos. Las personas que sufren ciertas enfermedades de alto riesgo deben aplicarse una o ms dosis.  Vacuna contra la hepatitis A. Las Advertising copywriter deseen estar protegidas contra esta enfermedad, que sufren ciertas enfermedades de alto riesgo, que trabajan con animales infectados con hepatitis A, que trabajan en los laboratorios de investigacin de hepatitis A, o que viajan o trabajan en pases con una alta tasa de hepatitis A deben recibir la vacuna. Los personas que no fueron vacunadas previamente y Deno Etienne a tener un contacto cercano con una persona adoptada fuera del pas, deben recibir la vacuna durante los primeros [address] despus de su llegada a los [place] desde un pas con una alta tasa de hepatitis A.  Vacuna contra la hepatitis B. Las Advertising copywriter deseen estar protegidas contra esta enfermedad, que sufren ciertas enfermedades de alto riesgo, que puedan estar expuestas  a sangre u otros fluidos corporales infecciosos, que tienen contactos familiares o parejas sexuales con hepatitis B positivo, que sean clientes o trabajadores de ciertos centros de atencin, o que viajan o  trabajan en pases con una alta tasa de hepatitis B deben recibir la vacuna.  Vacuna antihaemophilus influenzae tipo B (Hib). Una persona no vacunada previamente, que sufra de asplenia o de anemia drepanoctica, o que tenga una esplenectoma programada debe recibir una dosis de la vacuna Hib. Independientemente de la vacunacin previa, un paciente trasplantado con clulas madre hematopoyticas debe recibir Ardelia Mems serie de tres dosis, de 6 a 12 meses despus del trasplante exitoso. La vacuna Hib no est recomendada para personas adultas infectadas con VIH. Controles preventivos - Rayetta Pigg Entre 19 y 42aos  Control de la presin arterial.**/Cada uno a [employer].  Control de lpidos y colesterol.**Carma Lair cinco aos a partir de los 20 aos.  Examen clnico de Johnson [employer] Johnson.**Carma Lair 3 aos en las Principal Financial 20 y los 100 aos.  Evaluacin del riesgo de cncer relacionado con el BRCA.**/Para mujeres que tienen familiares con cncer relacionado con el BRCA (cncer de mama, de ovario, de trompas y peritoneal).  Prueba de Papanicolaou.**Carma Lair dos [employer] 21 y los 56 aos. Cada tres aos a Proofreader de los 14 aos y [place] 29 o 61 aos, con una historia de tres pruebas de Papanicolaou normales consecutivas.  Pruebas de deteccin de VPH.**/Cada tres aos, a partir de los 31 aos y [employer] 30 o 22 aos, con una historia de tres pruebas de Papanicolaou normales consecutivas.  Anlisis de sangre para la hepatitis C.**/Para toda persona con riesgos conocidos de hepatitis C.  Autoexamen de piel /todos los meses  [place] antigripal. [place] los aos  Vacuna contra la difteria, ttanos y Advice worker (DTPa, DT).**/Consulte a su mdico. Las mujeres embarazadas deben recibir una dosis de la vacuna DTPa en cada embarazo. Una dosis de DT cada 10 aos.  Vacuna contra la varicela.**/Consulte a su mdico. Las embarazadas sin prueba de inmunidad deben recibir la primera dosis despus del  Media planner.  Vacuna contra el virus del Engineer, technical sales (VPH). /3 dosis en el curso de 6 meses, si tiene 14 aos o menos. La vacuna no se recomienda en mujeres embarazadas. Sin embargo, no es Chartered loss adjuster una prueba de [place] antes de recibir una dosis.  Vacuna contra el sarampin, la rubola y las paperas ([place]).Marland KitchenEarleen Newport aplicarse al menos una dosis de SRP si ha nacido en 1956 o despus. Podra tambin necesitar una 2.da dosis. En las mujeres en edad frtil, debe determinarse la inmunidad contra la rubola. Si no hay prueba de inmunidad, las mujeres que no estn embarazadas deben vacunarse. Si no hay prueba de inmunidad, las mujeres que estn embarazadas deben retrasar la vacunacin hasta despus del [place].  Vacuna antineumoccica conjugada 13 valente (PCV13).Marland KitchenCecille Amsterdam a su mdico.  Vacuna antineumoccica de polisacridos (PPSV23).**/De una a dos dosis si es fumador o si sufre Actuary.  Vacuna antimeningoccica.**/Si tiene entre 48 y 64 aos y es estudiante universitario de Editor, commissioning que vive en una residencia estudiantil o tiene alguna enfermedad grave, debe recibir Ardelia Mems dosis de esta vacuna. Podra tambin necesitar dosis de refuerzo.  Vacuna contra la hepatitis A.**/Consulte a su mdico.  Vacuna contra la hepatitis B.**/Consulte a su mdico.  Vacuna antihaemophilus influenzae tipo B (Hib).**/Consulte a su mdico. Entre 40 y 49aos  Control de la presin arterial.**/Cada uno a International aid/development worker.  Control de lpidos y colesterol.**Carma Lair cinco aos a Proofreader  de los 20 aos.  Pruebas de deteccin de cncer de pulmn. /Todos los aos si tiene entre 56 y 40 aos, y si ha fumado durante 82 aos un paquete diario y sigue fumando o dej el hbito en algn momento en los ltimos 15 aos. Los estudios de Pharmacologist se interrumpen cuando haya dejado de fumar durante al menos 15aos o si tiene un problema de salud que le impida recibir tratamiento para Science writer de pulmn.  Examen  clnico de [employer].**/Todos los aos despus de los 40 aos.  Evaluacin del riesgo de cncer relacionado con el BRCA.**/Para mujeres que tienen familiares con cncer relacionado con el BRCA (cncer de mama, de ovario, de trompas y peritoneal).  Mamografa.**/Una vez por ao a partir de los 17 aos, siempre que tenga buena salud. Consulte a su mdico.  Prueba de Papanicolaou.Marland KitchenCarma Lair tres aos despus de los 31 aos y [place] 22 o 25 aos, con una historia de tres pruebas de Papanicolaou normales consecutivas.  Pruebas de deteccin de VPH.**/Cada tres aos, a partir de los 63 aos y [place] 65 o 54 aos, con una historia de tres pruebas de Papanicolaou normales consecutivas.  Prueba de sangre oculta en materia fecal. Carma Lair ao a partir de los 51 [employer] 75 aos. No tendr que hacerlo si se realiza una colonoscopa cada 10 aos.  Colonoscopa o sigmoidoscopa flexible.Marland KitchenCarma Lair 5 aos para la sigmoidoscopa flexible o cada 10 aos para la colonoscopa, a Proofreader de los 6 aos de edad y [employer] 27 aos.  Anlisis de sangre para la hepatitis C.**/Para todas las personas nacidas entre 1945 y 1965, y a todo aquel que tenga un riesgo conocido de haber contrado esta enfermedad.  Autoexamen de piel /todos los meses  [place] antigripal. /Todos los aos  Vacuna contra la difteria, ttanos y Advice worker (DTPa/DT).**/Consulte a su mdico. Las mujeres embarazadas deben recibir una dosis de la vacuna DTPa en cada embarazo. Una dosis de DT cada 10 aos.  Vacuna contra la varicela.**/Consulte a su mdico. Las embarazadas sin prueba de inmunidad deben recibir la primera dosis despus del Media planner.  Vacuna contra el herpes zoster.Marland KitchenArdelia Mems dosis para adultos de 60 aos o ms.  Vacuna contra el sarampin, la rubola y las paperas ([place]).Marland KitchenEarleen Newport aplicarse al menos una dosis de SRP si ha nacido en 1957 o despus. Podra tambin necesitar una 2.da dosis. En las mujeres en edad frtil, debe  determinarse la inmunidad contra la rubola. Si no hay prueba de inmunidad, las mujeres que no estn embarazadas deben vacunarse. Si no hay prueba de inmunidad, las mujeres que estn embarazadas deben retrasar la vacunacin hasta despus del [place].  Vacuna antineumoccica conjugada 13 valente (PCV13).Marland KitchenCecille Amsterdam a su mdico.  Vacuna antineumoccica de polisacridos (PPSV23).**/De una a dos dosis si es fumador o si sufre Actuary.  Vacuna antimeningoccica.Marland KitchenCecille Amsterdam a su mdico.  Investment banker, operational contra la hepatitis A.**/Consulte a su mdico.  Vacuna contra la hepatitis B.**/Consulte a su mdico.  Vacuna antihaemophilus influenzae tipo B (Hib).**/Consulte a su mdico. Ms de 100 aos  Control de la presin arterial.**/Cada uno a International aid/development worker.  Control de lpidos y colesterol.**Carma Lair cinco aos a partir de los 20 aos.  Pruebas de deteccin de cncer de pulmn. /Todos los aos si tiene entre 35 y 90 aos, y si ha fumado durante 22 aos un paquete diario y sigue fumando o dej el hbito en algn momento en los ltimos 15 aos. Los estudios de deteccin anuales se interrumpen cuando haya dejado de  fumar durante al menos 15aos o si tiene un problema de salud que le impida recibir tratamiento para Science writer de pulmn.  Examen clnico de [employer].**/Todos los aos despus de los 40 aos.  Evaluacin del riesgo de cncer relacionado con el BRCA.**/Para mujeres que tienen familiares con cncer relacionado con el BRCA (cncer de mama, de ovario, de trompas y peritoneal).  Mamografa.**/Una vez por ao a partir de los 66 aos, siempre que tenga buena salud. Consulte a su mdico.  Prueba de Papanicolaou.**Carma Lair tres aos despus de los 68 aos y [place] 65 o 44 aos, con tres pruebas de Papanicolaou normales consecutivas. Las pruebas pueden interrumpirse [employer] 47 y los 13 aos, si tiene tres pruebas de Papanicolaou normales consecutivas y ninguna prueba de Papanicolaou ni de VPH anormal en  los ltimos 10 aos.  Pruebas de deteccin de VPH.**/Cada tres aos, a partir de los 36 aos y [place] 22 o 36 aos, con una historia de tres pruebas de Papanicolaou normales consecutivas. Las pruebas pueden interrumpirse [employer] 73 y los 47 aos, si tiene tres pruebas de Papanicolaou normales consecutivas y ninguna prueba de Papanicolaou ni de VPH anormal en los ltimos 10 aos.  Prueba de sangre oculta en materia fecal. Carma Lair ao a partir de los 51 [employer] 75 aos. No tendr que hacerlo si se realiza una colonoscopa cada 10 aos.  Colonoscopa o sigmoidoscopa flexible.Marland KitchenCarma Lair 5 aos para la sigmoidoscopa flexible o cada 10 aos para la colonoscopa, a Proofreader de los 81 aos de edad y [place] 42 aos.  Anlisis de sangre para la hepatitis C.**/Para todas las personas nacidas entre 1945 y 1965, y a todo aquel que tenga un riesgo conocido de haber contrado esta enfermedad.  Pruebas de deteccin de osteoporosis.Marland KitchenWesley Blas nica vez en mujeres de ms de 11 aos que tengan riesgo de fracturas u osteoporosis.  Autoexamen de piel /todos los meses  [place] antigripal. [place] los aos  Vacuna contra la difteria, ttanos y Advice worker (DTPa/DT).**/Una dosis de DT cada 10 aos.  Vacuna contra la varicela.**Cecille Amsterdam a su mdico.  Vacuna contra el herpes zoster.Marland KitchenArdelia Mems dosis para adultos de 60 aos o ms.  Vacuna antineumoccica conjugada 13 valente (PCV13).Marland KitchenCecille Amsterdam a su mdico.  Vacuna antineumoccica de polisacridos (PPSV23).Marland KitchenArdelia Mems dosis para todos los adultos de 65 aos o ms.  Vacuna antimeningoccica.Marland KitchenCecille Amsterdam a su mdico.  Investment banker, operational contra la hepatitis A.**/Consulte a su mdico.  Vacuna contra la hepatitis B.**/Consulte a su mdico.  Vacuna antihaemophilus influenzae tipo B (Hib).**/Consulte a su mdico. ** Los antecedentes familiares y personales de riesgos y enfermedades pueden Quarry manager las recomendaciones del mdico. Document Released: 08/30/2005 Document Revised:  11/25/2013 ExitCare Patient Information 2015 [place], [place]. This information is not intended to replace advice given to you by your health care provider. Make sure you discuss any questions you have with your health care provider.

## 2014-10-29 LAB — HCG, QUANTITATIVE, PREGNANCY: HCG, BETA CHAIN, QUANT, S: 17.5 m[IU]/mL

## 2015-08-20 ENCOUNTER — Encounter (HOSPITAL_COMMUNITY): Payer: Self-pay | Admitting: *Deleted

## 2015-12-21 IMAGING — US US OB COMP LESS 14 WK
1 series · 14 of 28 positions shown · non-contrast
Comparison: None.

CLINICAL DATA: Pregnant patient with vaginal bleeding. Quantitative
HCG [DATE].

EXAM:
OBSTETRIC <14 WK US AND TRANSVAGINAL OB US
TECHNIQUE: Both transabdominal and transvaginal ultrasound examinations were
performed for complete evaluation of the gestation as well as the
maternal uterus, adnexal regions, and pelvic cul-de-sac.
Transvaginal technique was performed to assess early pregnancy.

[Series 1: us ob comp less 14 wks · 14 of 50 slices shown]
[im 2/50]
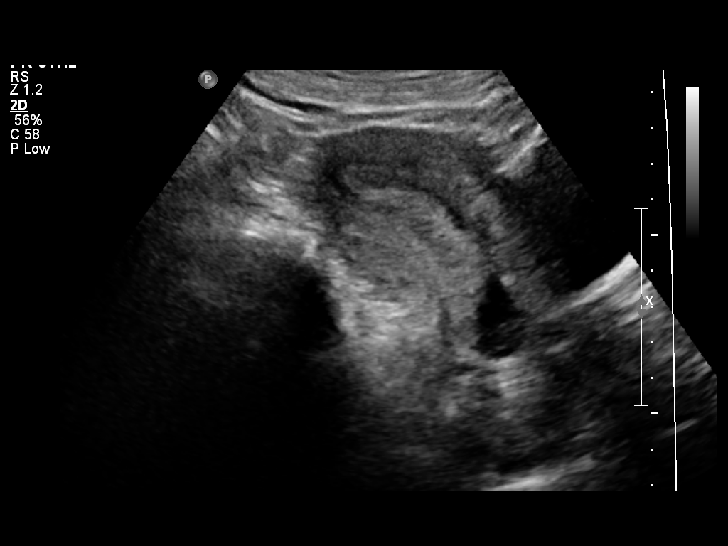
[im 6/50]
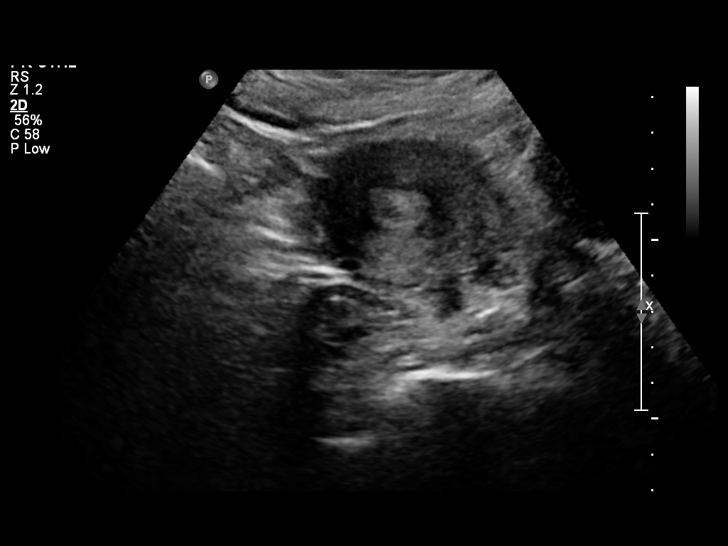
[im 10/50]
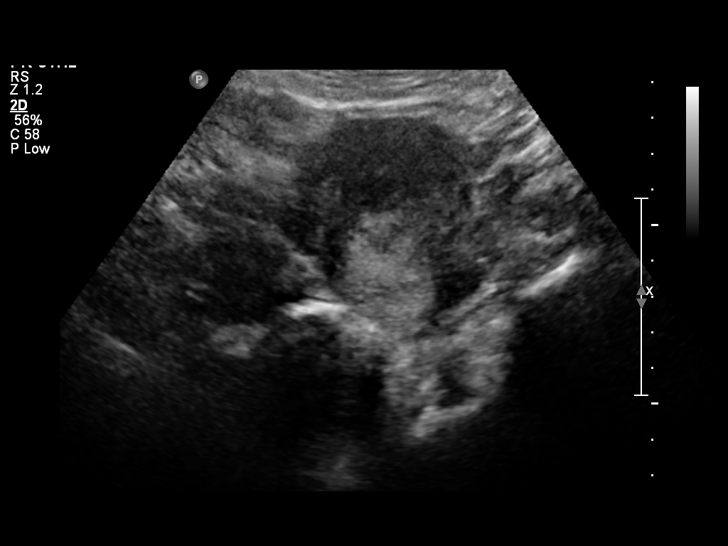
[im 13/50]
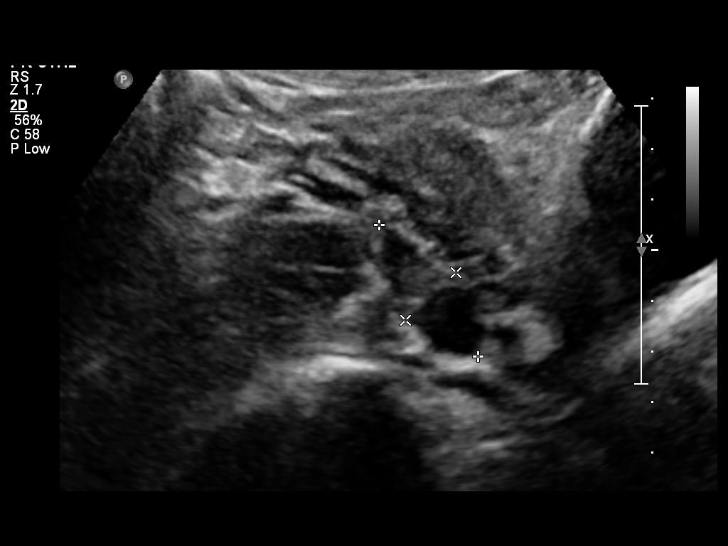
[im 17/50]
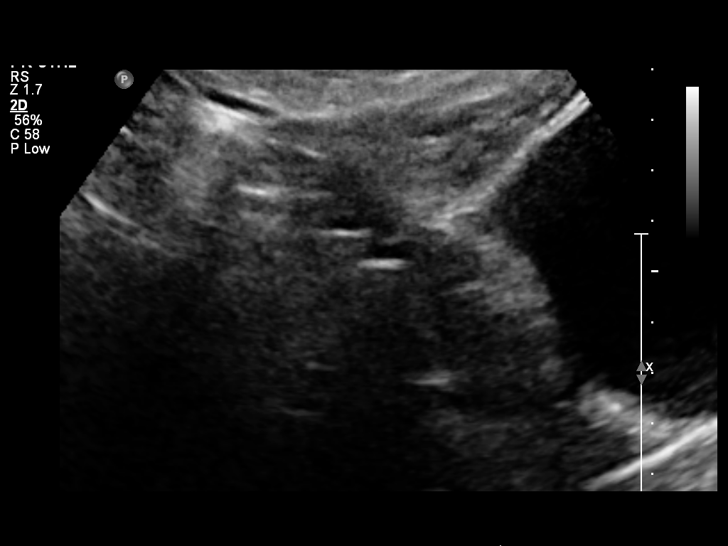
[im 20/50]
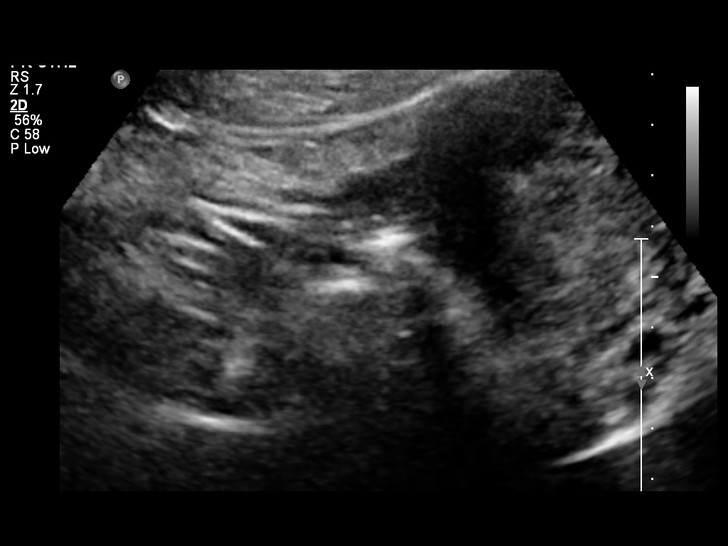
[im 24/50]
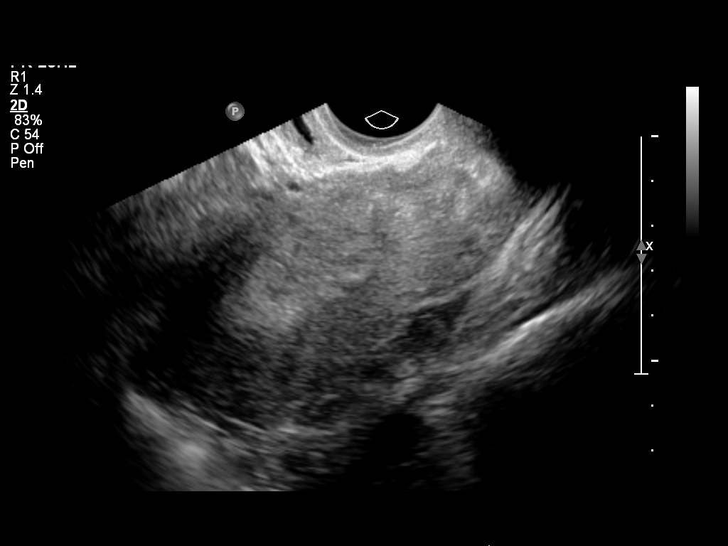
[im 28/50]
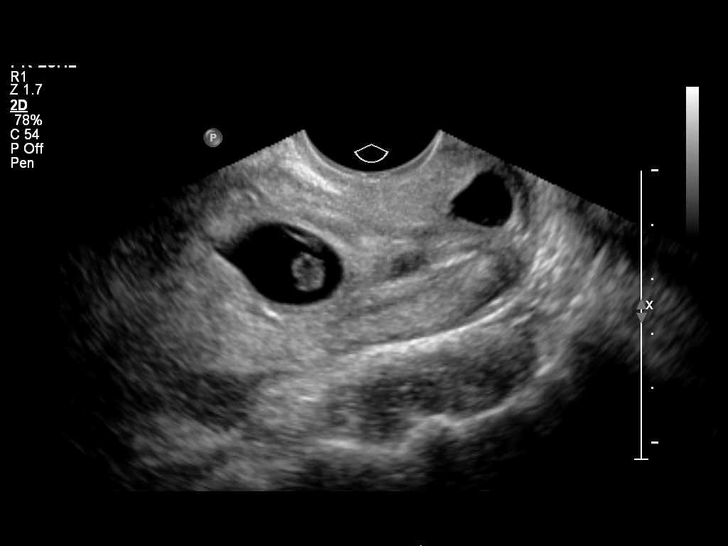
[im 31/50]
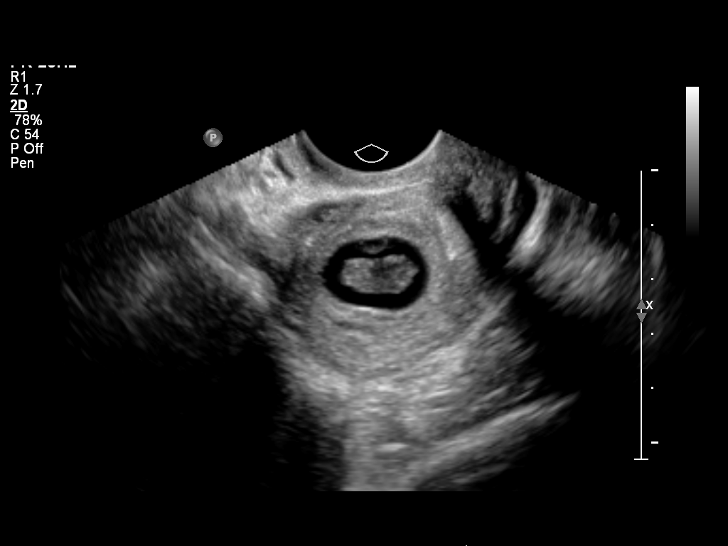
[im 35/50]
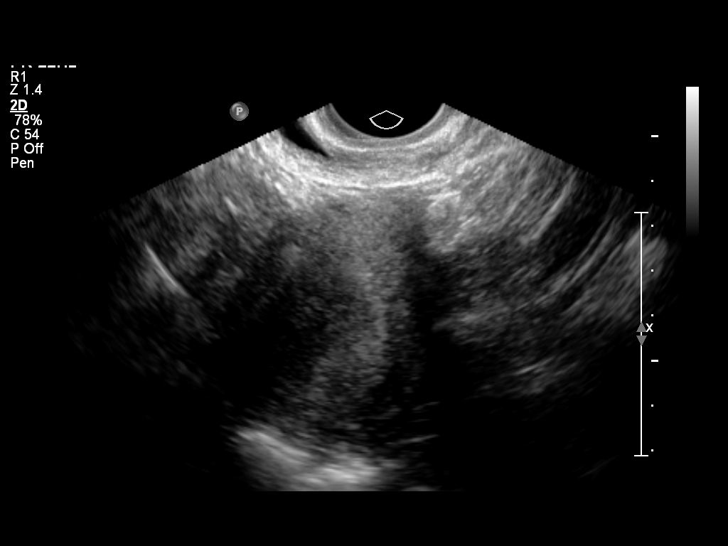
[im 39/50]
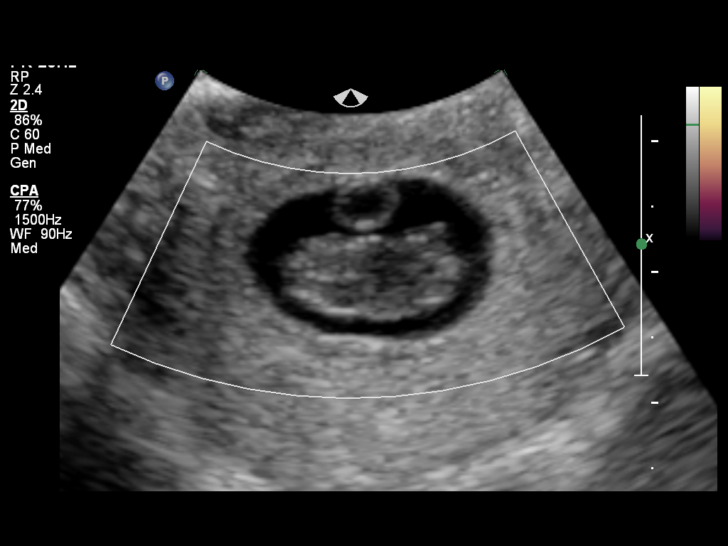
[im 42/50]
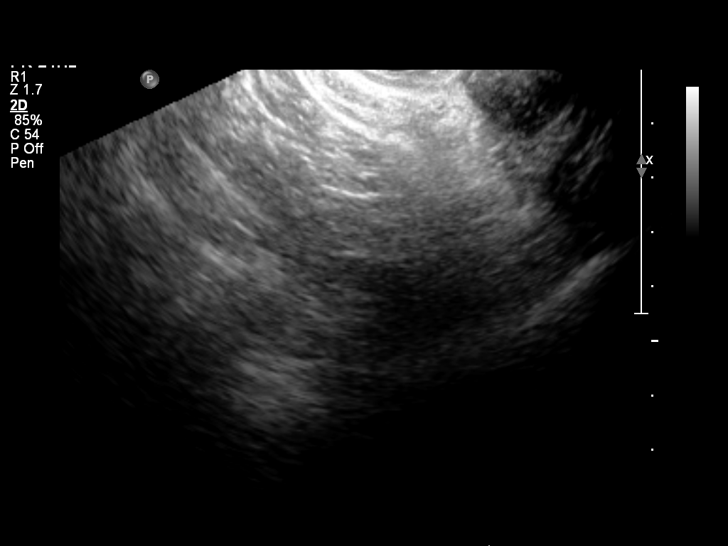
[im 46/50]
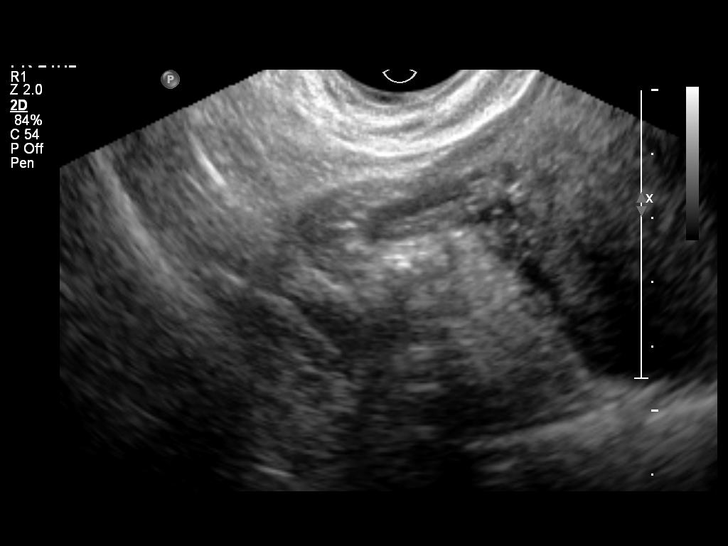
[im 50/50]
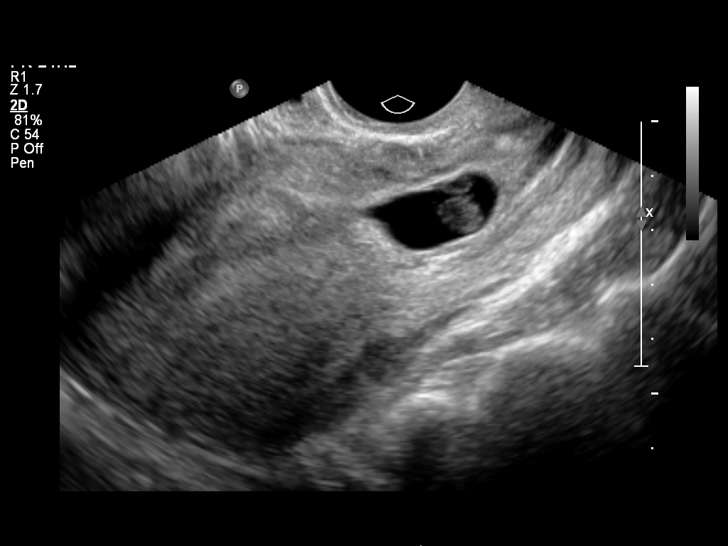

[14 of 28 positions shown; findings below may reference images not displayed]

FINDINGS: Intrauterine gestational sac: Visualized/normal in shape. The
gestational sac is only appearance segment.

Yolk sac:  Visualized.

Embryo:  Visualized.

Cardiac Activity: Not detected.

CRL:   1.47  mm   7 w 6 d                  US EDC: 05/29/2015.

Maternal uterus/adnexae: Unremarkable.
IMPRESSION: Findings meet definitive criteria for failed pregnancy. This follows
SRU consensus guidelines: Diagnostic Criteria for Nonviable
Pregnancy Early in the First Trimester. N Engl J Med
6390;[DATE]. The gestational sack is in the lower uterine
segment suggesting pending abortion.

## 2016-02-17 ENCOUNTER — Inpatient Hospital Stay (HOSPITAL_COMMUNITY): Payer: Self-pay

## 2016-02-17 ENCOUNTER — Inpatient Hospital Stay (HOSPITAL_COMMUNITY)
Admission: AD | Admit: 2016-02-17 | Discharge: 2016-02-18 | Disposition: A | Discharge status: Home / Self Care | Payer: Self-pay | Source: Ambulatory Visit | Attending: Obstetrics and Gynecology | Admitting: Obstetrics and Gynecology

## 2016-02-17 ENCOUNTER — Encounter (HOSPITAL_COMMUNITY): Payer: Self-pay

## 2016-02-17 DIAGNOSIS — O200 Threatened abortion: Secondary | ICD-10-CM

## 2016-02-17 DIAGNOSIS — O209 Hemorrhage in early pregnancy, unspecified: Secondary | ICD-10-CM

## 2016-02-17 DIAGNOSIS — Z3A10 10 weeks gestation of pregnancy: Secondary | ICD-10-CM | POA: Insufficient documentation

## 2016-02-17 DIAGNOSIS — O3680X0 Pregnancy with inconclusive fetal viability, not applicable or unspecified: Secondary | ICD-10-CM

## 2016-02-17 LAB — URINE MICROSCOPIC-ADD ON

## 2016-02-17 LAB — CBC
HEMATOCRIT: 32.1 % — AB (ref 36.0–46.0)
Hemoglobin: 10.8 g/dL — ABNORMAL LOW (ref 12.0–15.0)
MCH: 29.2 pg (ref 26.0–34.0)
MCHC: 33.6 g/dL (ref 30.0–36.0)
MCV: 86.8 fL (ref 78.0–100.0)
Platelets: 177 10*3/uL (ref 150–400)
RBC: 3.7 MIL/uL — ABNORMAL LOW (ref 3.87–5.11)
RDW: 13.6 % (ref 11.5–15.5)
WBC: 11.3 10*3/uL — ABNORMAL HIGH (ref 4.0–10.5)

## 2016-02-17 LAB — POCT PREGNANCY, URINE: Preg Test, Ur: POSITIVE — AB

## 2016-02-17 LAB — URINALYSIS, ROUTINE W REFLEX MICROSCOPIC
BILIRUBIN URINE: NEGATIVE
GLUCOSE, UA: NEGATIVE mg/dL
KETONES UR: 15 mg/dL — AB
Leukocytes, UA: NEGATIVE
NITRITE: NEGATIVE
PH: 5.5 (ref 5.0–8.0)
Protein, ur: 30 mg/dL — AB
Specific Gravity, Urine: >1.03 — ABNORMAL HIGH (ref 1.005–1.030)

## 2016-02-17 LAB — WET PREP, GENITAL
Clue Cells Wet Prep HPF POC: NONE SEEN
SPERM: NONE SEEN
Trich, Wet Prep: NONE SEEN
WBC, Wet Prep HPF POC: NONE SEEN
YEAST WET PREP: NONE SEEN

## 2016-02-17 LAB — HCG, QUANTITATIVE, PREGNANCY: hCG, Beta Chain, Quant, S: 2887 m[IU]/mL — ABNORMAL HIGH (ref <5)

## 2016-02-17 NOTE — Discharge Instructions (Signed)
Amenaza de aborto °(Threatened Miscarriage) °La amenaza de aborto se produce cuando hay hemorragia vaginal durante las primeras 20 semanas de embarazo, pero el embarazo no se interrumpe. Si durante este período usted tiene hemorragia vaginal, el médico le hará pruebas para asegurarse de que el embarazo continúe. Si las pruebas muestran que usted continúa embarazada y que el "bebé" en desarrollo (feto) dentro del útero sigue creciendo, se considera que tuvo una amenaza de aborto. °La amenaza de aborto no implica que el embarazo vaya a terminar, pero sí aumenta el riesgo de perder el embarazo (aborto completo). °CAUSAS  °Por lo general, no se conoce la causa de la amenaza de aborto. Si el resultado final es el aborto completo, la causa más frecuente es la cantidad anormal de cromosomas del feto. Los cromosomas son las estructuras internas de las células que contienen todo el material genético. °Algunas de las causas de hemorragia vaginal que no ocasionan un aborto incluyen: °· Las relaciones sexuales. °· Las infecciones. °· Los cambios hormonales normales durante el embarazo. °· La hemorragia que se produce cuando el óvulo se implanta en el útero. °FACTORES DE RIESGO °Los factores de riesgo de hemorragia al principio del embarazo incluyen: °· Obesidad. °· Fumar. °· El consumo de cantidades excesivas de alcohol o cafeína. °· El consumo de drogas. °SIGNOS Y SÍNTOMAS °· Hemorragia vaginal leve. °· Dolor o cólicos abdominales leves. °DIAGNÓSTICO  °Si tiene hemorragia con o sin dolor abdominal antes de las 20 semanas de embarazo, el médico le hará pruebas para determinar si el embarazo continúa. Una prueba importante incluye el uso de ondas sonoras y de una computadora (ecografía) para crear imágenes del interior del útero. Otras pruebas incluyen el examen interno de la vagina y el útero (examen pélvico), y el control de la frecuencia cardíaca del feto.  °Es posible que le diagnostiquen una amenaza de aborto en los  siguientes casos: °· La ecografía muestra que el embarazo continúa. °· La frecuencia cardíaca del feto es alta. °· El examen pélvico muestra que la apertura entre el útero y la vagina (cuello del útero) está cerrada. °· Su frecuencia cardíaca y su presión arterial están estables. °· Los análisis de sangre confirman que el embarazo continúa. °TRATAMIENTO  °No se ha demostrado que ningún tratamiento evite que una amenaza de aborto se convierta en un aborto completo. Sin embargo, los cuidados adecuados en el hogar son importantes.  °INSTRUCCIONES PARA EL CUIDADO EN EL HOGAR  °· Asegúrese de asistir a todas las citas de cuidados prenatales. Esto es muy importante. °· Descanse lo suficiente. °· No tenga relaciones sexuales ni use tampones si tiene hemorragia vaginal. °· No se haga duchas vaginales. °· No fume ni consuma drogas. °· No beba alcohol. °· Evite la cafeína. °SOLICITE ATENCIÓN MÉDICA SI: °· Tiene una ligera hemorragia o manchado vaginal durante el embarazo. °· Tiene dolor o cólicos en el abdomen. °· Tiene fiebre. °SOLICITE ATENCIÓN MÉDICA DE INMEDIATO SI: °· Tiene una hemorragia vaginal abundante. °· Elimina coágulos de sangre por la vagina. °· Siente dolor en la parte baja de la espalda o cólicos abdominales intensos. °· Tiene fiebre, escalofríos y dolor abdominal intenso. °ASEGÚRESE DE QUE: °· Comprende estas instrucciones. °· Controlará su afección. °· Recibirá ayuda de inmediato si no mejora o si empeora. °  °Esta información no tiene como fin reemplazar el consejo del médico. Asegúrese de hacerle al médico cualquier pregunta que tenga. °  °Document Released: 08/30/2005 Document Revised: 11/25/2013 °Elsevier Interactive Patient Education ©2016 Elsevier Inc. ° °

## 2016-02-17 NOTE — MAU Note (Signed)
Pt states she missed her period in February and then had positive home preg test but today she began having bleeding and pain.

## 2016-02-17 NOTE — MAU Provider Note (Signed)
History     CSN: [id number]  Arrival date and time: 02/17/16 2153   First Provider Initiated Contact with Patient 02/17/16 2227      Chief Complaint  Patient presents with  . Vaginal Bleeding  . Pelvic Pain   Vaginal Bleeding This is a new problem. The current episode started today. The problem occurs intermittently. The problem has been unchanged. Pain severity now: 8/10  The problem affects both sides. She is pregnant. The vaginal discharge was bloody. The vaginal bleeding is heavier than menses. She has been passing clots (about the size of a fist. ). Nothing aggravates the symptoms. She has tried nothing for the symptoms. Her menstrual history has been regular (LMP: 12/17/15 ).   Past Medical History  Diagnosis Date  . Medical history non-contributory     Past Surgical History  Procedure Laterality Date  . No past surgeries    . Cesarean section      History reviewed. No pertinent family history.  Social History  Substance Use Topics  . Smoking status: Never Smoker   . Smokeless tobacco: Never Used  . Alcohol Use: No    Allergies: No Known Allergies  Prescriptions prior to admission  Medication Sig Dispense Refill Last Dose  . HYDROcodone-acetaminophen (NORCO) 5-325 MG per tablet Take 1 tablet by mouth every 6 (six) hours as needed for moderate pain. (Patient not taking: Reported on 10/28/2014) 20 tablet 0 Not Taking  . misoprostol (CYTOTEC) 200 MCG tablet Take 4 tablets (800 mcg total) by mouth 4 (four) times daily. (Patient not taking: Reported on 10/28/2014) 4 tablet 0 Not Taking  . norgestimate-ethinyl estradiol (ORTHO-CYCLEN,SPRINTEC,PREVIFEM) 0.25-35 MG-MCG tablet Take 1 tablet by mouth daily. 1 Package 11   . Prenatal Vit-Fe Fumarate-FA (MULTIVITAMIN-PRENATAL) 27-0.8 MG TABS tablet Take 1 tablet by mouth daily at 12 noon.   Not Taking  . promethazine (PHENERGAN) 25 MG tablet Take 0.5 tablets (12.5 mg total) by mouth every 6 (six) hours as needed for nausea.  (Patient not taking: Reported on 10/28/2014) 20 tablet 0 Not Taking    Review of Systems  Genitourinary: Positive for vaginal bleeding.   Physical Exam   Blood pressure 108/63, pulse 68, temperature 98.3 F (36.8 C), temperature source Oral, resp. rate 16, height 5\' 1"  (1.549 m), weight 56.7 kg (125 lb), last menstrual period 01/03/2016, SpO2 100 %, unknown if currently breastfeeding.  Physical Exam  Nursing note and vitals reviewed. Constitutional: She is oriented to person, place, and time. She appears well-developed and well-nourished. No distress.  HENT:  Head: Normocephalic.  Cardiovascular: Normal rate.   Respiratory: Effort normal.  GI: Soft. There is no tenderness. There is no rebound.  Genitourinary:   External: no lesion Vagina: moderate amount of BRB in vagina.  Cervix: pink, smooth, bulbous cervix.  Uterus: slightly enlarged    Neurological: She is alert and oriented to person, place, and time.  Skin: Skin is warm and dry.  Psychiatric: She has a normal mood and affect.   Results for orders placed or performed during the hospital encounter of 02/17/16 (from the past 24 hour(s))  Urinalysis, Routine w reflex microscopic ([facility])     Status: Abnormal   Collection Time: 02/17/16 10:05 PM  Result Value Ref Range   Color, Urine YELLOW YELLOW   APPearance CLOUDY (A) CLEAR   Specific Gravity, Urine >1.030 (H) 1.005 - 1.030   pH 5.5 5.0 - 8.0   Glucose, UA NEGATIVE NEGATIVE mg/dL   Hgb urine dipstick LARGE (A)  NEGATIVE   Bilirubin Urine NEGATIVE NEGATIVE   Ketones, ur 15 (A) NEGATIVE mg/dL   Protein, ur 30 (A) NEGATIVE mg/dL   Nitrite NEGATIVE NEGATIVE   Leukocytes, UA NEGATIVE NEGATIVE  Urine microscopic-add on     Status: Abnormal   Collection Time: 02/17/16 10:05 PM  Result Value Ref Range   Squamous Epithelial / LPF 0-5 (A) NONE SEEN   WBC, UA 0-5 0 - 5 WBC/hpf   RBC / HPF TOO NUMEROUS TO COUNT 0 - 5 RBC/hpf   Bacteria, UA MANY (A) NONE SEEN    Urine-Other URINALYSIS PERFORMED ON SUPERNATANT   Pregnancy, urine POC     Status: Abnormal   Collection Time: 02/17/16 10:19 PM  Result Value Ref Range   Preg Test, Ur POSITIVE (A) NEGATIVE  hCG, quantitative, pregnancy     Status: Abnormal   Collection Time: 02/17/16 10:37 PM  Result Value Ref Range   hCG, Beta Chain, Quant, S 2887 (H) <5 mIU/mL  CBC     Status: Abnormal   Collection Time: 02/17/16 10:37 PM  Result Value Ref Range   WBC 11.3 (H) 4.0 - 10.5 K/uL   RBC 3.70 (L) 3.87 - 5.11 MIL/uL   Hemoglobin 10.8 (L) 12.0 - 15.0 g/dL   HCT 16.1 (L) 09.6 - 04.5 %   MCV 86.8 78.0 - 100.0 fL   MCH 29.2 26.0 - 34.0 pg   MCHC 33.6 30.0 - 36.0 g/dL   RDW 40.9 81.1 - 91.4 %   Platelets 177 150 - 400 K/uL  Wet prep, genital     Status: None   Collection Time: 02/17/16 10:40 PM  Result Value Ref Range   Yeast Wet Prep HPF POC NONE SEEN NONE SEEN   Trich, Wet Prep NONE SEEN NONE SEEN   Clue Cells Wet Prep HPF POC NONE SEEN NONE SEEN   WBC, Wet Prep HPF POC NONE SEEN NONE SEEN   Sperm NONE SEEN    US Ob Comp Less 14 Wks  02/17/2016  CLINICAL DATA:  Bleeding and pain today. EXAM: OBSTETRIC <14 WK [place] AND TRANSVAGINAL OB US TECHNIQUE: Both transabdominal and transvaginal ultrasound examinations were performed for complete evaluation of the gestation as well as the maternal uterus, adnexal regions, and pelvic cul-de-sac. Transvaginal technique was performed to assess early pregnancy. COMPARISON:  None. FINDINGS: Intrauterine gestational sac: No normal-appearing intrauterine gestational sac seen. Yolk sac:  Not seen Embryo:  Not seen Cardiac Activity: Not seen Heart Rate:   bpm Subchorionic hemorrhage:  None visualized. Maternal uterus/adnexae: Large amount of complex material within the endometrial canal of the lower uterine segment, extending into the endocervical canal, presumably blood products. Uterus appears otherwise unremarkable. Both maternal ovaries appear normal and there is no mass or  free fluid identified in either adnexal region. No free fluid in the cul-de-sac. IMPRESSION: 1. No intrauterine pregnancy identified. 2. Fairly large amount of complex material within the endometrial canal of the lower uterine segment, extending into the endocervical canal, presumably blood products, possibly related to spontaneous abortion in progress. 3. Both maternal ovaries appear normal and there is no mass or free fluid identified in either adnexal region. Electronically Signed   By: Bary Richard M.D.   On: 02/17/2016 23:45   US Ob Transvaginal  02/17/2016  CLINICAL DATA:  Bleeding and pain today. EXAM: OBSTETRIC <14 WK [place] AND TRANSVAGINAL OB US TECHNIQUE: Both transabdominal and transvaginal ultrasound examinations were performed for complete evaluation of the gestation as well as the maternal uterus,  adnexal regions, and pelvic cul-de-sac. Transvaginal technique was performed to assess early pregnancy. COMPARISON:  None. FINDINGS: Intrauterine gestational sac: No normal-appearing intrauterine gestational sac seen. Yolk sac:  Not seen Embryo:  Not seen Cardiac Activity: Not seen Heart Rate:   bpm Subchorionic hemorrhage:  None visualized. Maternal uterus/adnexae: Large amount of complex material within the endometrial canal of the lower uterine segment, extending into the endocervical canal, presumably blood products. Uterus appears otherwise unremarkable. Both maternal ovaries appear normal and there is no mass or free fluid identified in either adnexal region. No free fluid in the cul-de-sac. IMPRESSION: 1. No intrauterine pregnancy identified. 2. Fairly large amount of complex material within the endometrial canal of the lower uterine segment, extending into the endocervical canal, presumably blood products, possibly related to spontaneous abortion in progress. 3. Both maternal ovaries appear normal and there is no mass or free fluid identified in either adnexal region. Electronically Signed   By: Bary Richard M.D.   On: 02/17/2016 23:45    MAU Course  Procedures  MDM   Assessment and Plan   1. Threatened abortion in first trimester   2. Vaginal bleeding in pregnancy, first trimester   3. Pregnancy of unknown anatomic location    DC home Comfort measures reviewed  1st Trimester precautions  Bleeding precautions Ectopic precautions RX: none    Follow-up Information    Follow up with [facility] [place] MATERNITY ADMISSIONS.   Why:  Saturday 3/18 at night or Sunday 3/19 in the morning for repeat bloodwork.    Contact information:   [address] [id number] mc [place] [address] [phone]        Tawnya Crook 02/17/2016, 10:31 PM

## 2016-02-18 LAB — GC/CHLAMYDIA PROBE AMP (~~LOC~~) NOT AT ARMC
CHLAMYDIA, DNA PROBE: NEGATIVE
NEISSERIA GONORRHEA: NEGATIVE

## 2016-02-19 ENCOUNTER — Inpatient Hospital Stay (HOSPITAL_COMMUNITY)
Admission: AD | Admit: 2016-02-19 | Discharge: 2016-02-19 | Disposition: A | Discharge status: Home / Self Care | Payer: Self-pay | Source: Ambulatory Visit | Attending: Obstetrics & Gynecology | Admitting: Obstetrics & Gynecology

## 2016-02-19 DIAGNOSIS — Z3491 Encounter for supervision of normal pregnancy, unspecified, first trimester: Secondary | ICD-10-CM | POA: Insufficient documentation

## 2016-02-19 DIAGNOSIS — O0281 Inappropriate change in quantitative human chorionic gonadotropin (hCG) in early pregnancy: Secondary | ICD-10-CM

## 2016-02-19 DIAGNOSIS — O3680X0 Pregnancy with inconclusive fetal viability, not applicable or unspecified: Secondary | ICD-10-CM

## 2016-02-19 LAB — URINALYSIS, ROUTINE W REFLEX MICROSCOPIC
Bilirubin Urine: NEGATIVE
Glucose, UA: NEGATIVE mg/dL
Ketones, ur: NEGATIVE mg/dL
Leukocytes, UA: NEGATIVE
Nitrite: NEGATIVE
Protein, ur: NEGATIVE mg/dL
Specific Gravity, Urine: <1.005 — ABNORMAL LOW (ref 1.005–1.030)
pH: 6 (ref 5.0–8.0)

## 2016-02-19 LAB — HCG, QUANTITATIVE, PREGNANCY: hCG, Beta Chain, Quant, S: 490 m[IU]/mL — ABNORMAL HIGH (ref <5)

## 2016-02-19 LAB — URINE MICROSCOPIC-ADD ON: RBC / HPF: NONE SEEN RBC/hpf (ref 0–5)

## 2016-02-19 LAB — RPR: RPR Ser Ql: NONREACTIVE

## 2016-02-19 LAB — HIV ANTIBODY (ROUTINE TESTING W REFLEX): HIV SCREEN 4TH GENERATION: NONREACTIVE

## 2016-02-19 NOTE — MAU Note (Signed)
Pt here for repeat BHCG, denies bleeding or pain. Does reports some pain in her lower abd when she urinates.

## 2016-02-19 NOTE — MAU Provider Note (Signed)
No chief complaint on file.   Subjective:   Pt is a 40 y.o. [id number] here for follow-up BHCG.  Upon review of the records patient was first seen on 02/17/16 for vaginal  bleeding.   BHCG on that day was 2887. Repeat Quant is 490. Ultrasound showed No IUP.    Pt here today with no report of abdominal pain or vaginal bleeding.   All other systems negative.    Past Medical History  Diagnosis Date  . Medical history non-contributory     OB History  Gravida Para Term Preterm AB SAB TAB Ectopic Multiple Living  4 2   1 1    2     # Outcome Date GA Lbr Len/2nd Weight Sex Delivery Anes PTL Lv  4 Current           3 Para           2 Para           1 SAB               No family history on file.  Objective: Physical Exam  Filed Vitals:   02/19/16 2058  BP: 101/67  Pulse: 83  Temp: 98.5 F (36.9 C)  Resp: 16   Constitutional: She is oriented to person, place, and time. She appears well-developed and well-nourished. No distress.  Pulmonary/Chest: Effort normal. No respiratory distress.  Musculoskeletal: Normal range of motion.  Neurological: She is alert and oriented to person, place, and time.  Skin: Skin is warm and dry.    Assessment: 40 y.o. G4P0012 at 1529w1d wks Pregnancy Follow-up BHCG  Plan: Follow up Beta HCG in clinic in 48 hours Discharge

## 2016-02-19 NOTE — MAU Note (Signed)
Lori Clemmons CNM brought pt to RM 6 to discuss test results and then pt d/c home from there. [place] Spanish interpreter used for d/c

## 2016-02-19 NOTE — Progress Notes (Signed)
Assisted RN with discharge instructions.  Spanish Interpreter

## 2016-02-19 NOTE — Discharge Instructions (Signed)

## 2016-02-22 ENCOUNTER — Ambulatory Visit: Payer: Self-pay

## 2016-02-22 DIAGNOSIS — O039 Complete or unspecified spontaneous abortion without complication: Secondary | ICD-10-CM

## 2016-02-22 LAB — HCG, QUANTITATIVE, PREGNANCY: hCG, Beta Chain, Quant, S: 171.5 m[IU]/mL — ABNORMAL HIGH

## 2016-03-23 ENCOUNTER — Telehealth: Payer: Self-pay | Admitting: *Deleted

## 2016-03-23 NOTE — Telephone Encounter (Signed)
Per note in chart need to call patient with Spanish interpreter to get hcg weekly until normal.

## 2016-03-29 NOTE — Telephone Encounter (Signed)
Pt has been schedule to come in on Monday 04/03/2016 for hcg.

## 2016-04-03 ENCOUNTER — Ambulatory Visit: Payer: Self-pay

## 2016-04-04 ENCOUNTER — Ambulatory Visit: Payer: Self-pay

## 2016-12-22 ENCOUNTER — Encounter (HOSPITAL_COMMUNITY): Payer: Self-pay

## 2017-04-23 IMAGING — US US OB TRANSVAGINAL
1 series · 15 of 28 positions shown · non-contrast
Comparison: None.

CLINICAL DATA: Bleeding and pain today.

EXAM:
OBSTETRIC <14 WK US AND TRANSVAGINAL OB US
TECHNIQUE: Both transabdominal and transvaginal ultrasound examinations were
performed for complete evaluation of the gestation as well as the
maternal uterus, adnexal regions, and pelvic cul-de-sac.
Transvaginal technique was performed to assess early pregnancy.

[Series 1: us ob transvaginal · 15 of 73 slices shown]
[im 1/73]
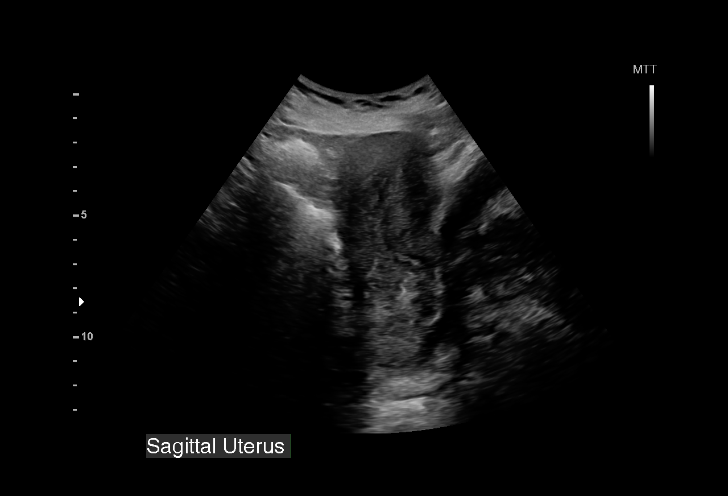
[im 6/73]
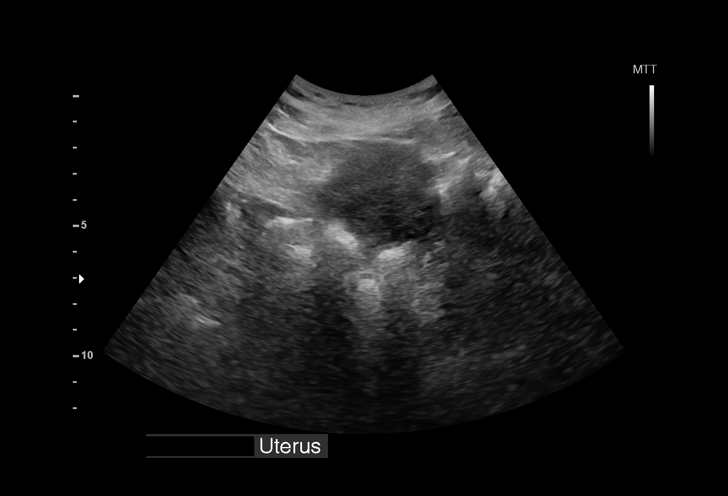
[im 11/73]
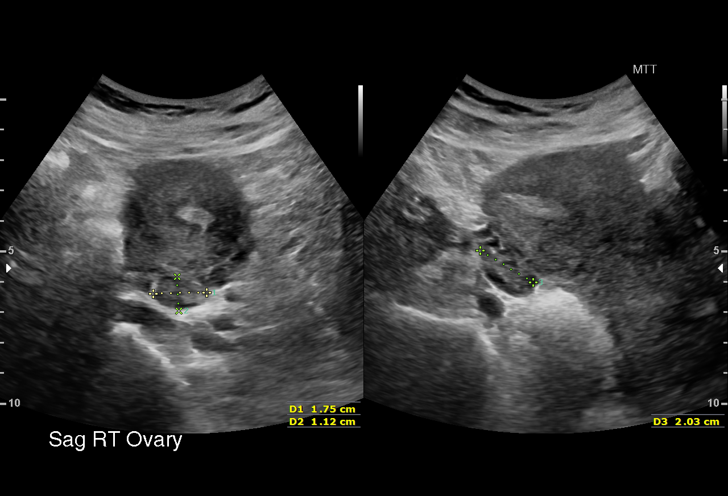
[im 17/73]
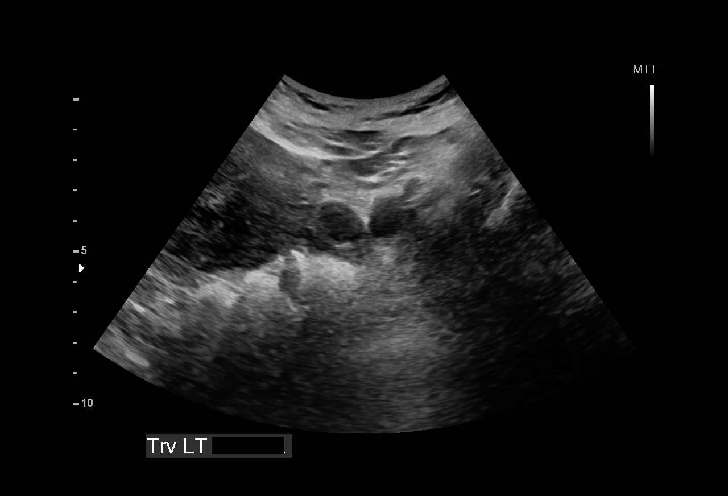
[im 22/73]
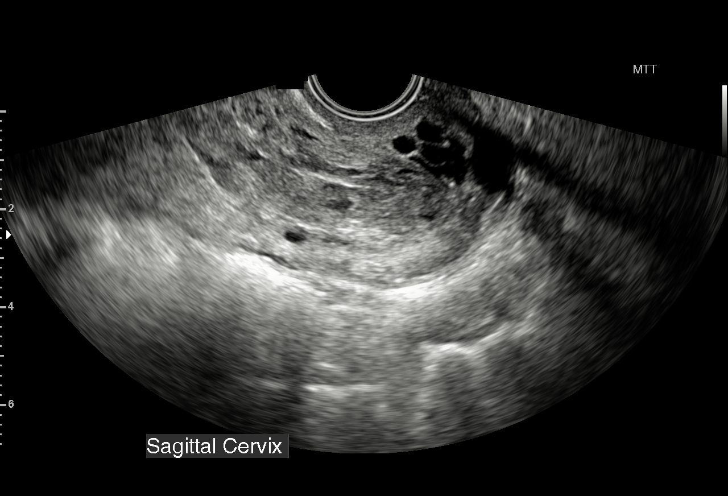
[im 27/73]
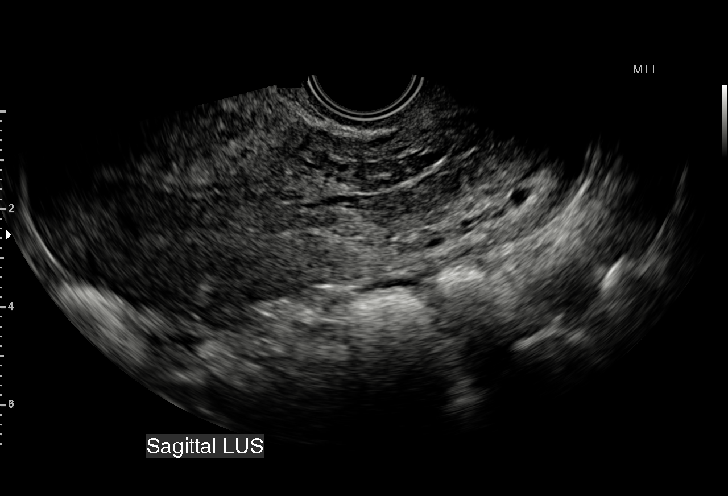
[im 33/73]
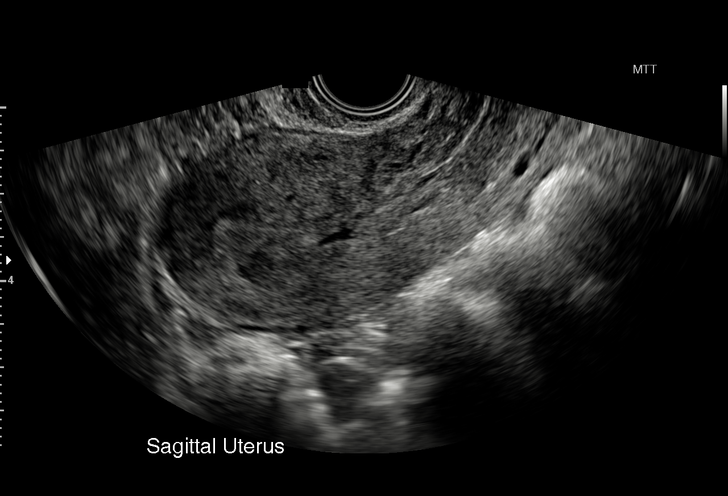
[im 38/73]
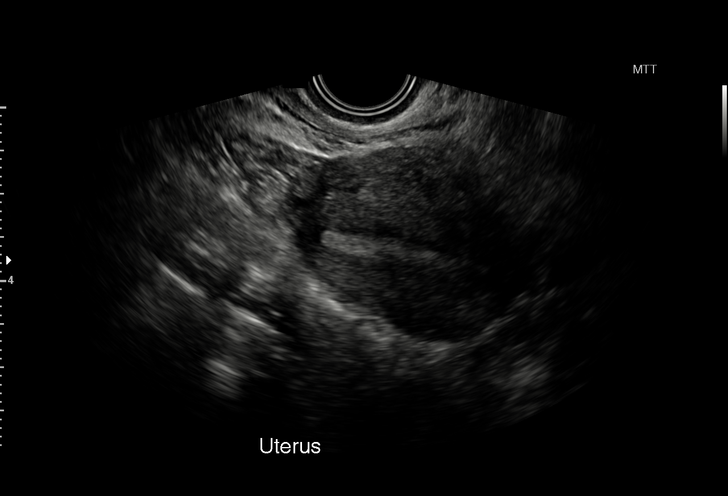
[im 41/73]
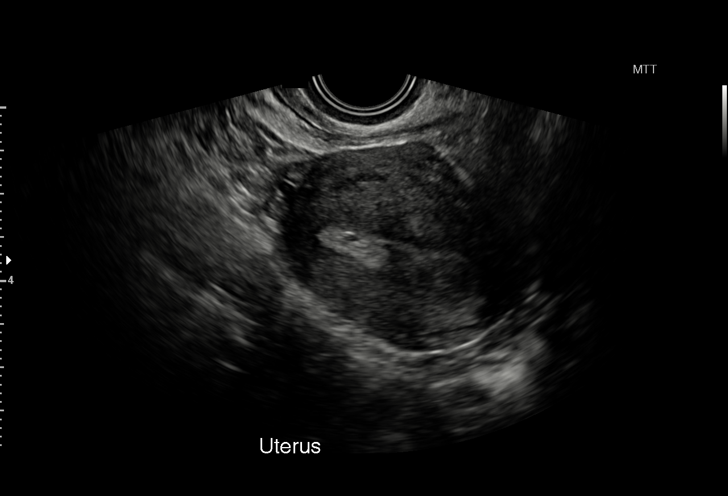
[im 46/73]
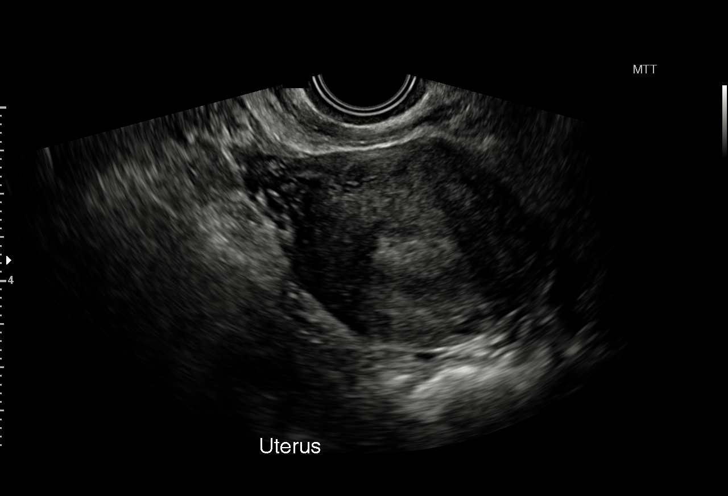
[im 51/73]
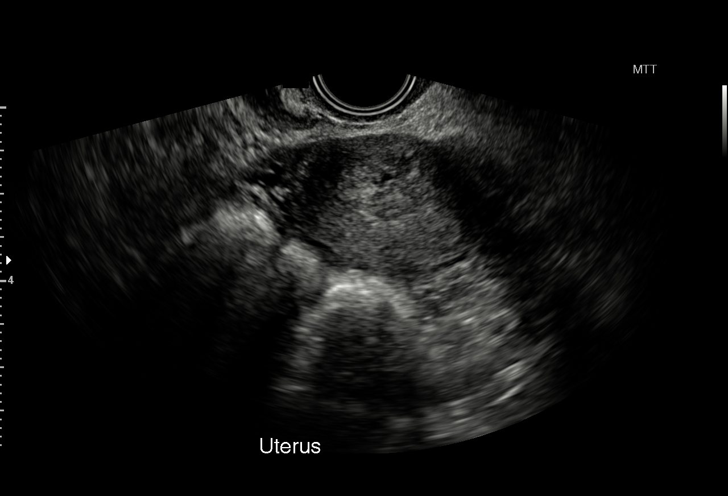
[im 57/73]
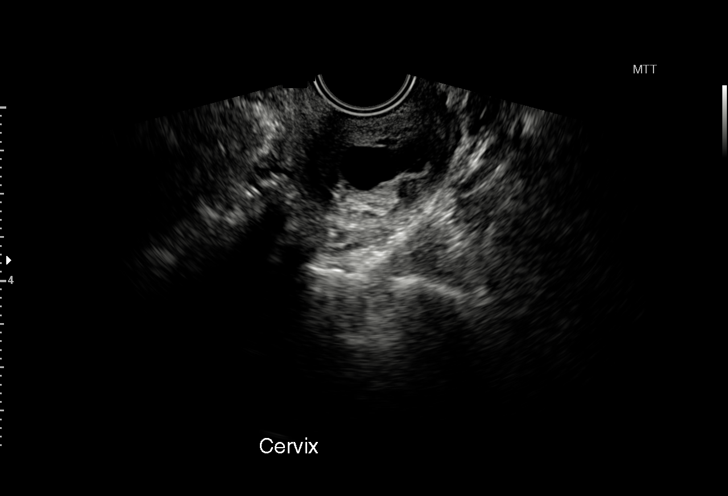
[im 62/73]
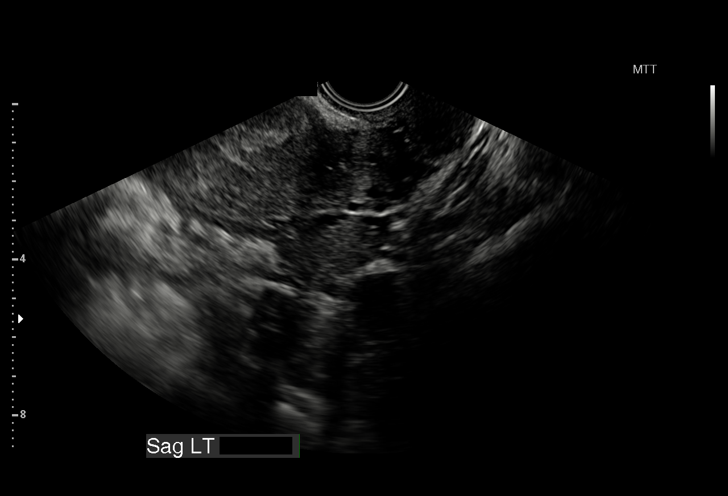
[im 67/73]
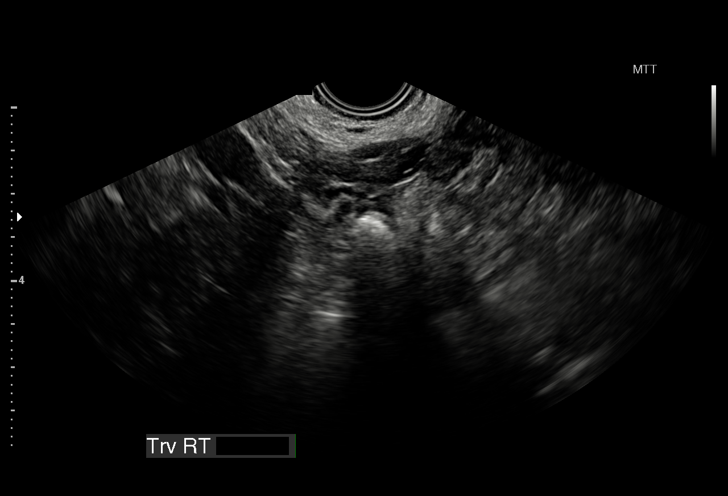
[im 73/73]
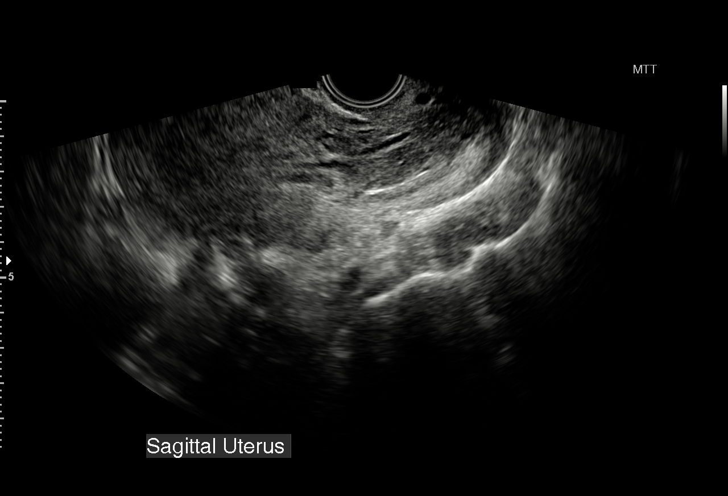

[15 of 28 positions shown; findings below may reference images not displayed]

FINDINGS: Intrauterine gestational sac: No normal-appearing intrauterine
gestational sac seen.

Yolk sac:  Not seen

Embryo:  Not seen

Cardiac Activity: Not seen

Heart Rate:   bpm

Subchorionic hemorrhage:  None visualized.

Maternal uterus/adnexae: Large amount of complex material within the
endometrial canal of the lower uterine segment, extending into the
endocervical canal, presumably blood products. Uterus appears
otherwise unremarkable.

Both maternal ovaries appear normal and there is no mass or free
fluid identified in either adnexal region. No free fluid in the
cul-de-sac.
IMPRESSION: 1. No intrauterine pregnancy identified.
2. Fairly large amount of complex material within the endometrial
canal of the lower uterine segment, extending into the endocervical
canal, presumably blood products, possibly related to spontaneous
abortion in progress.
3. Both maternal ovaries appear normal and there is no mass or free
fluid identified in either adnexal region.

## 2022-03-15 ENCOUNTER — Ambulatory Visit: Payer: Self-pay | Admitting: Internal Medicine

## 2023-08-22 ENCOUNTER — Encounter (HOSPITAL_COMMUNITY): Payer: Self-pay

## 2023-08-22 ENCOUNTER — Other Ambulatory Visit: Payer: Self-pay

## 2023-08-22 ENCOUNTER — Emergency Department (HOSPITAL_COMMUNITY)
Admission: EM | Admit: 2023-08-22 | Discharge: 2023-08-22 | Disposition: A | Discharge status: Home / Self Care | Payer: No Typology Code available for payment source | Attending: Emergency Medicine | Admitting: Emergency Medicine

## 2023-08-22 ENCOUNTER — Emergency Department (HOSPITAL_COMMUNITY): Payer: No Typology Code available for payment source

## 2023-08-22 DIAGNOSIS — R0781 Pleurodynia: Secondary | ICD-10-CM | POA: Diagnosis not present

## 2023-08-22 DIAGNOSIS — S61511A Laceration without foreign body of right wrist, initial encounter: Secondary | ICD-10-CM | POA: Diagnosis not present

## 2023-08-22 DIAGNOSIS — S61512A Laceration without foreign body of left wrist, initial encounter: Secondary | ICD-10-CM | POA: Diagnosis not present

## 2023-08-22 DIAGNOSIS — M79642 Pain in left hand: Secondary | ICD-10-CM

## 2023-08-22 DIAGNOSIS — S6991XA Unspecified injury of right wrist, hand and finger(s), initial encounter: Secondary | ICD-10-CM | POA: Diagnosis present

## 2023-08-22 DIAGNOSIS — M7989 Other specified soft tissue disorders: Secondary | ICD-10-CM | POA: Insufficient documentation

## 2023-08-22 MED ORDER — ACETAMINOPHEN 500 MG PO TABS
1000.0000 mg | ORAL_TABLET | Freq: Once | ORAL | Status: AC
  Administered 2023-08-22: 1000 mg via ORAL
  Filled 2023-08-22: qty 2
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
## 2023-08-22 NOTE — Progress Notes (Signed)
Orthopedic Tech Progress Note Patient Details:  Katie Blackburn 07-24-1976 [id number]  Ortho Devices Type of Ortho Device: Velcro wrist splint Ortho Device/Splint Location: bi lat wrist splints applied Ortho Device/Splint Interventions: Ordered, Application, Adjustment   Post Interventions Patient Tolerated: Well Instructions Provided: Adjustment of device, Care of device  Kizzie Fantasia 08/22/2023, 8:41 PM
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
